# Patient Record
Sex: Male | Born: 1990 | Race: White | Hispanic: No | Marital: Married | Smoking: Never smoker
Health system: Southern US, Academic
[De-identification: ages and names within clinical notes are randomized; demographics above are authoritative.]

## PROBLEM LIST (undated history)

## (undated) DIAGNOSIS — F419 Anxiety disorder, unspecified: Secondary | ICD-10-CM

## (undated) DIAGNOSIS — I32 Pericarditis in diseases classified elsewhere: Secondary | ICD-10-CM

## (undated) DIAGNOSIS — Z973 Presence of spectacles and contact lenses: Secondary | ICD-10-CM

## (undated) HISTORY — PX: CARDIAC CATHETERIZATION: SHX172

## (undated) HISTORY — PX: MOUTH SURGERY: SHX715

## (undated) HISTORY — PX: HX HEART CATHETERIZATION: SHX148

---
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
## 2022-03-29 IMAGING — DX XRAY KNEE 4 OR MORE VIEWS RT
1 series · 3 of 3 positions shown · non-contrast
Comparison: None available.

﻿EXAM:  83181   XRAY KNEE 3 VIEWS LT,XRAY KNEE 3 VIEWS RT, INCLUDING AP WEIGHT-BEARING VIEWS:
INDICATION: Bilateral chronic knee pain.  Trauma due to fall 2 months ago.
TECHNIQUE: Three views of both knees including AP weight-bearing views.

[Series 1: apweightbearing · 0.14mm/px · 3 of 3 slices shown]
[im 1/3]
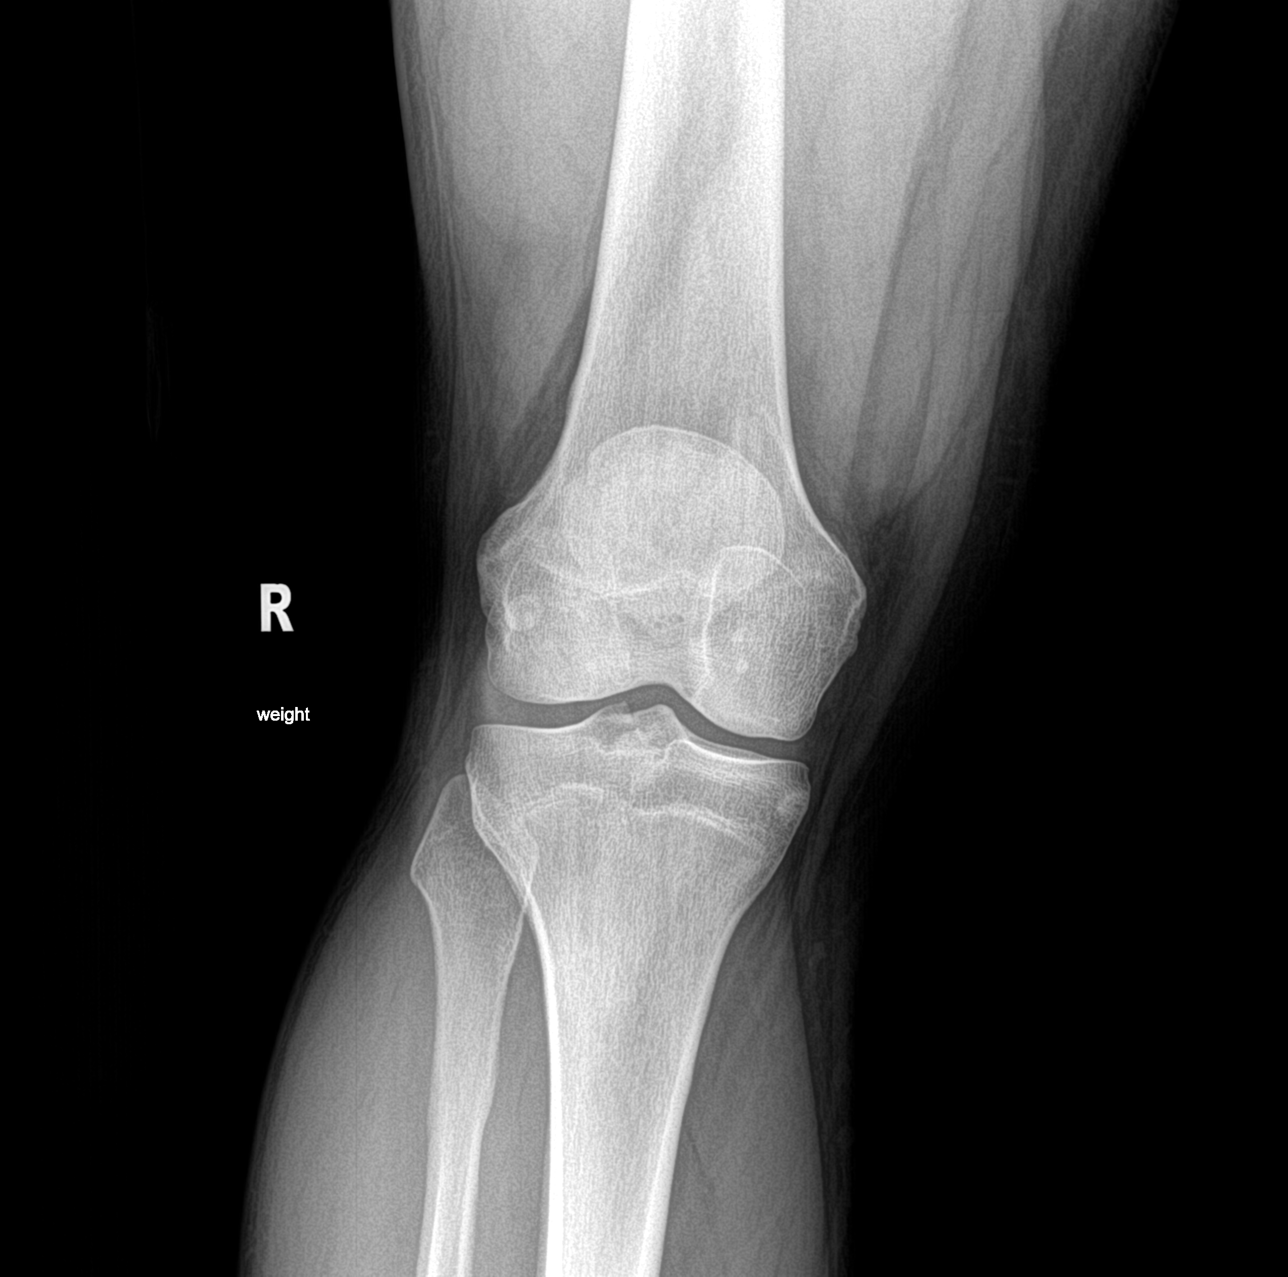
[im 2/3]
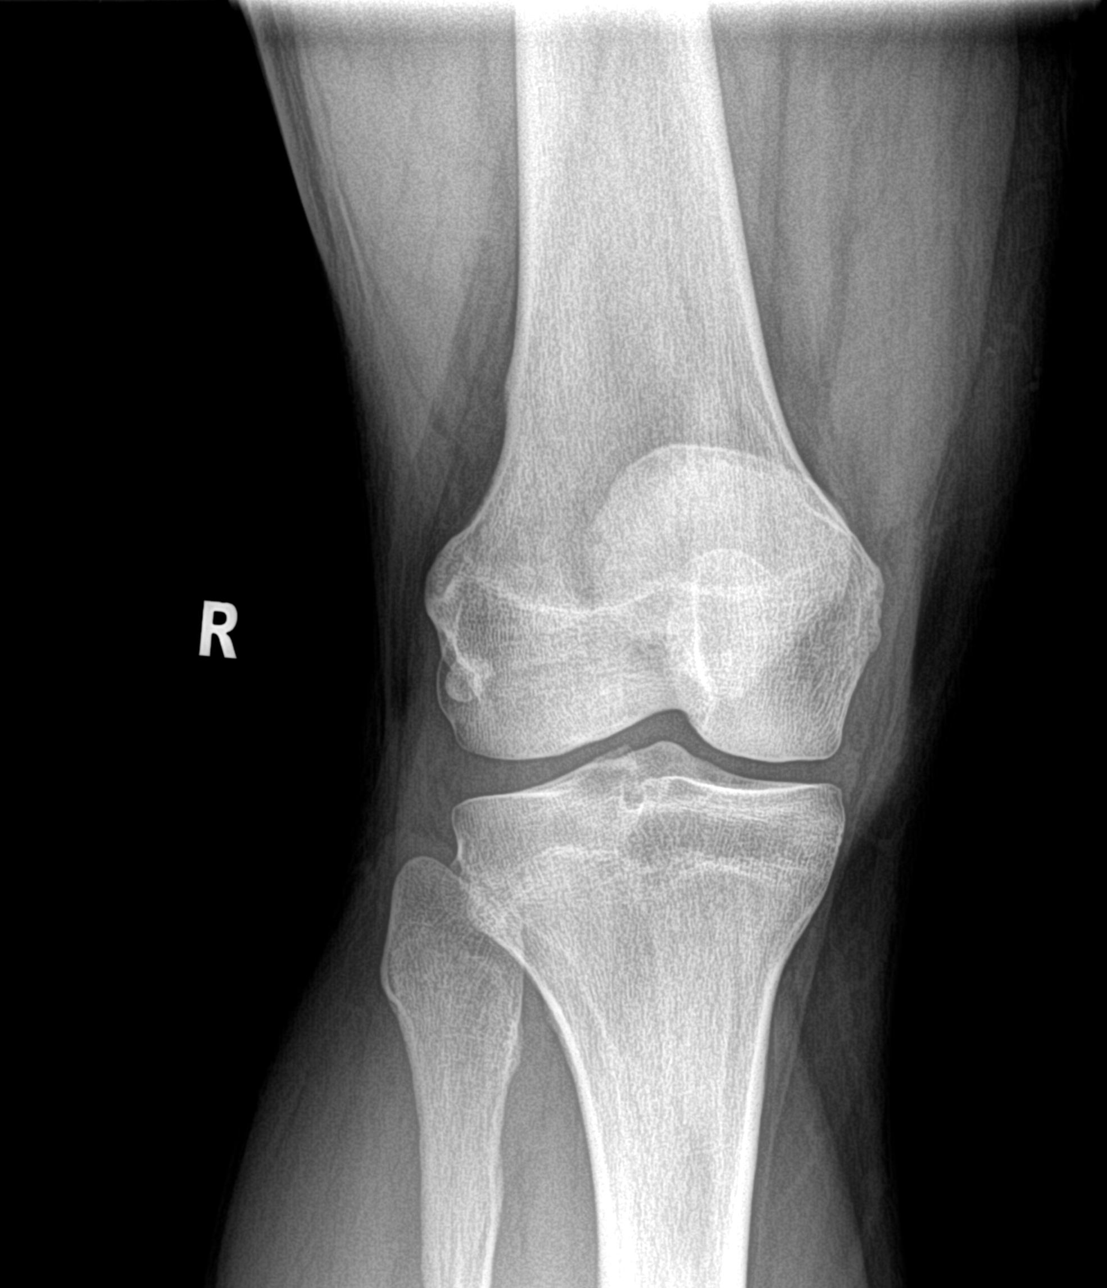
[im 3/3]
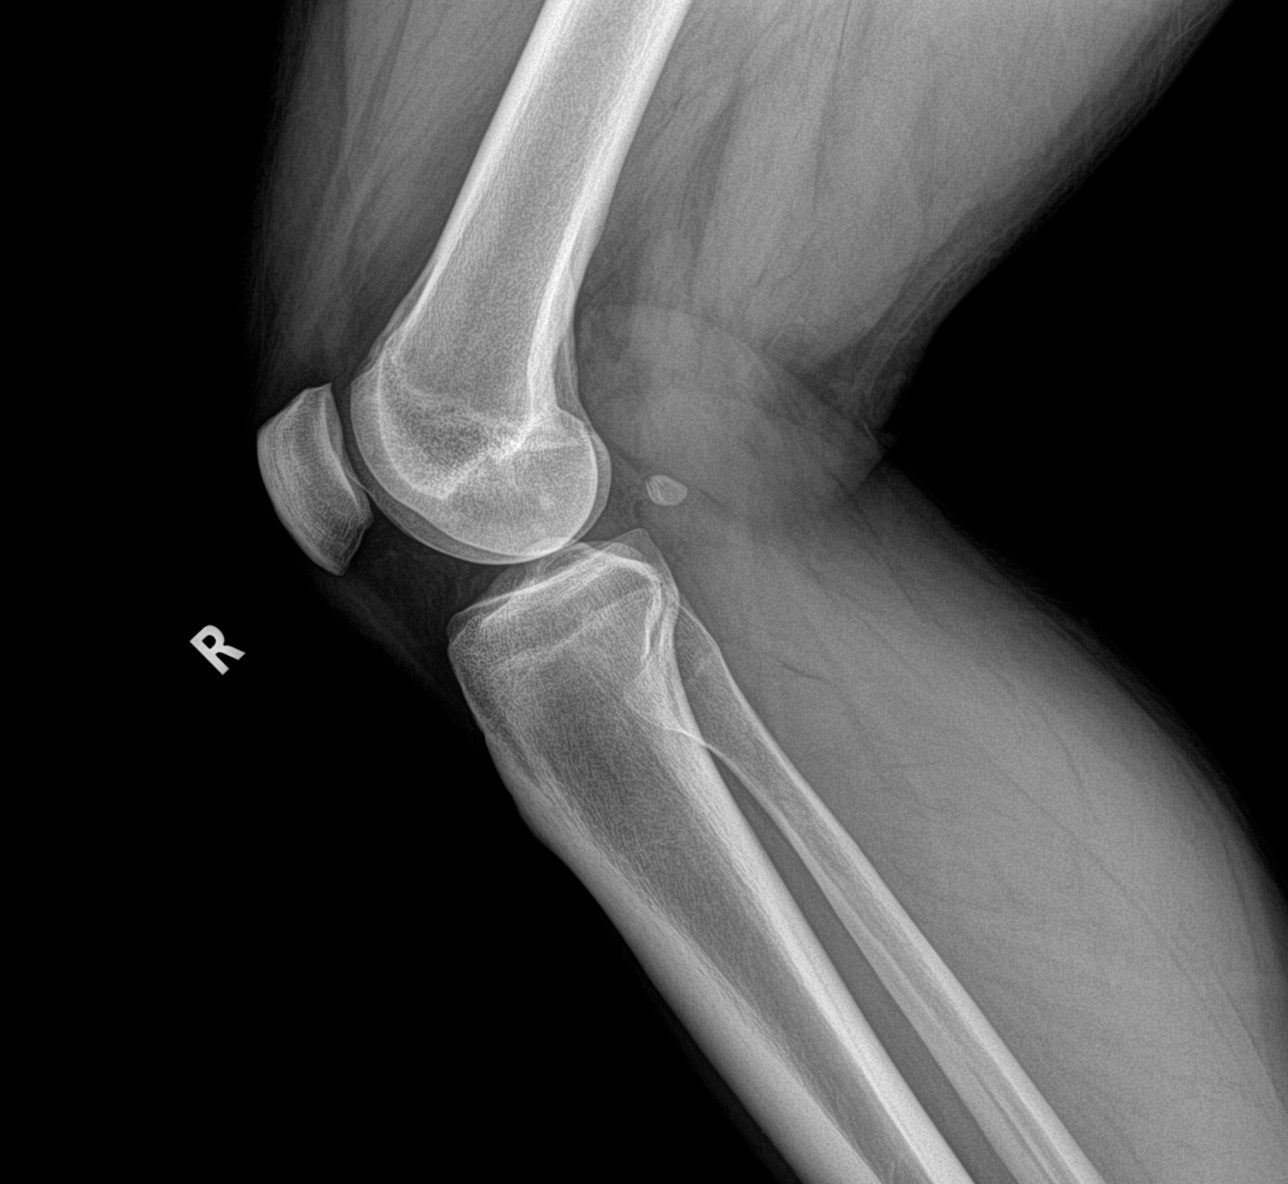

[3 of 3 positions shown; findings below may reference images not displayed]

FINDINGS: No acute bony lesions are seen at both knees.  Mild, grade 1 to grade 2 degenerative changes of medial compartment of both knee joints are noted. 

Small focal irregularity of the bony cortex is noted at the metaphyseal region of the proximal left tibia on the medial aspect. This could be secondary to an old trauma.  This does not appear to be an aggressive lesion.  Soft tissues are unremarkable.
IMPRESSION: 1. No acute bone changes of both knees. Mild osteoarthritis of medial compartment of both knees.

2. Focal irregularity of bony cortex over the medial aspect of the proximal left tibia in the metaphyseal region.  Possible changes due to old trauma versus a flat osteochondroma.  If patient has localized symptoms, further evaluation by MRI including postcontrast study can be performed.  

Electronically Signed by REINER, CARLOSJ at [id number]-RDRG [DATE]

## 2022-03-29 IMAGING — DX XRAY KNEE 4 OR MORE VIEWS LT
1 series · 3 of 3 positions shown · non-contrast
Comparison: None available.

﻿EXAM:  83181   XRAY KNEE 3 VIEWS LT,XRAY KNEE 3 VIEWS RT, INCLUDING AP WEIGHT-BEARING VIEWS:
INDICATION: Bilateral chronic knee pain.  Trauma due to fall 2 months ago.
TECHNIQUE: Three views of both knees including AP weight-bearing views.

[Series 1: apweightbearing · 0.14mm/px · 3 of 3 slices shown]
[im 1/3]
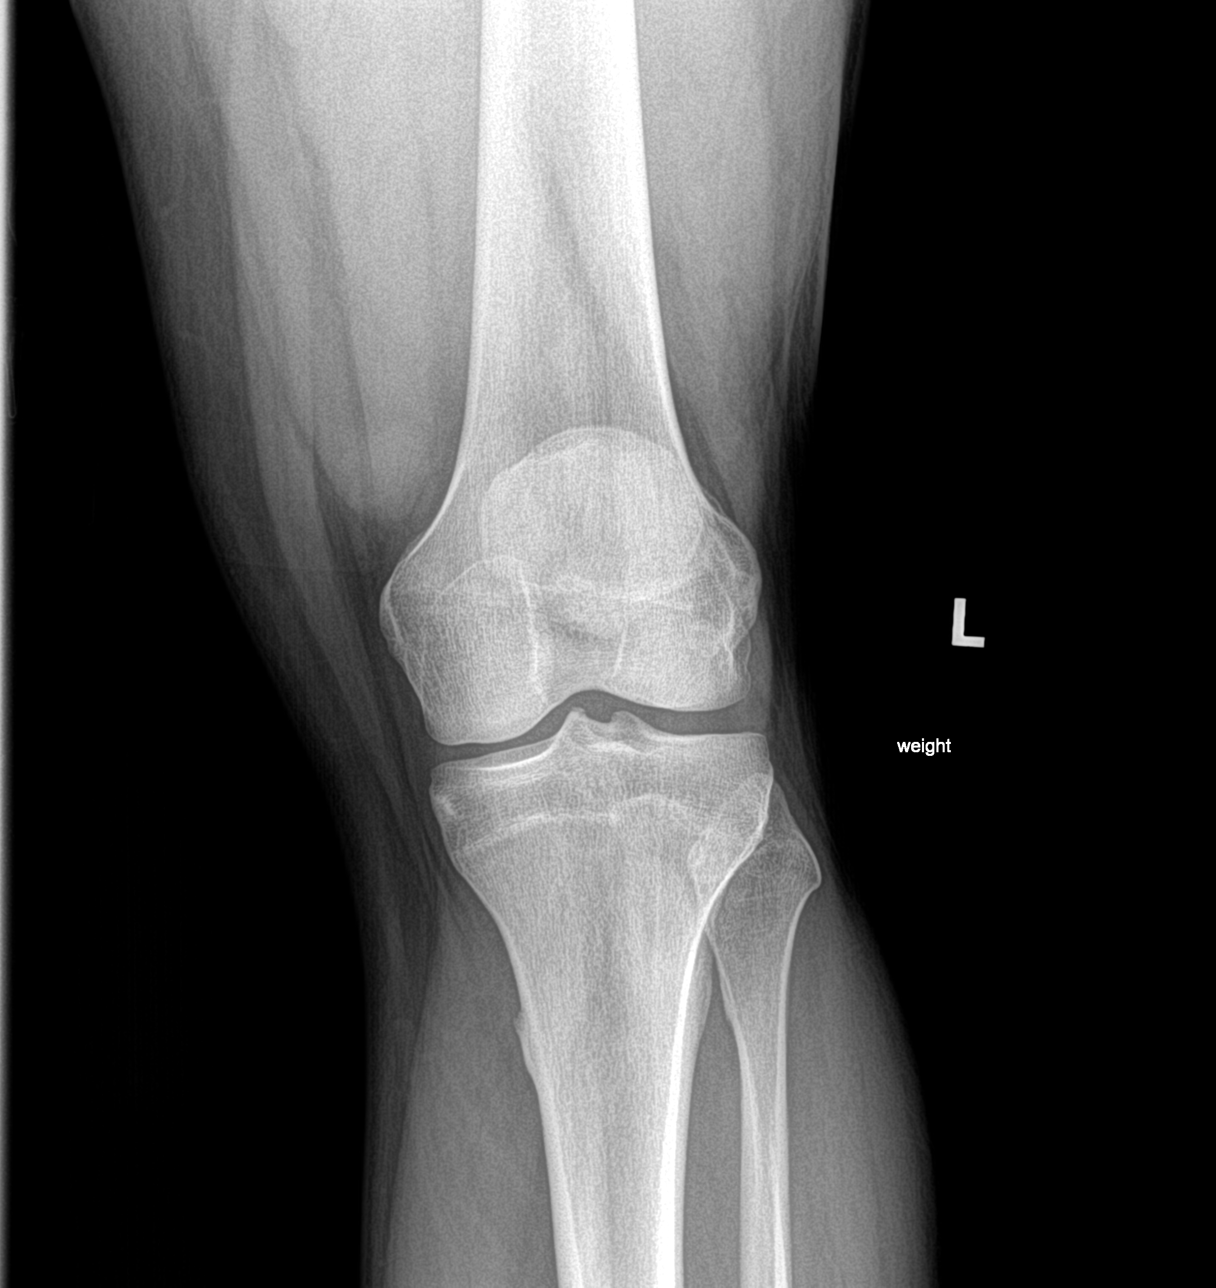
[im 2/3]
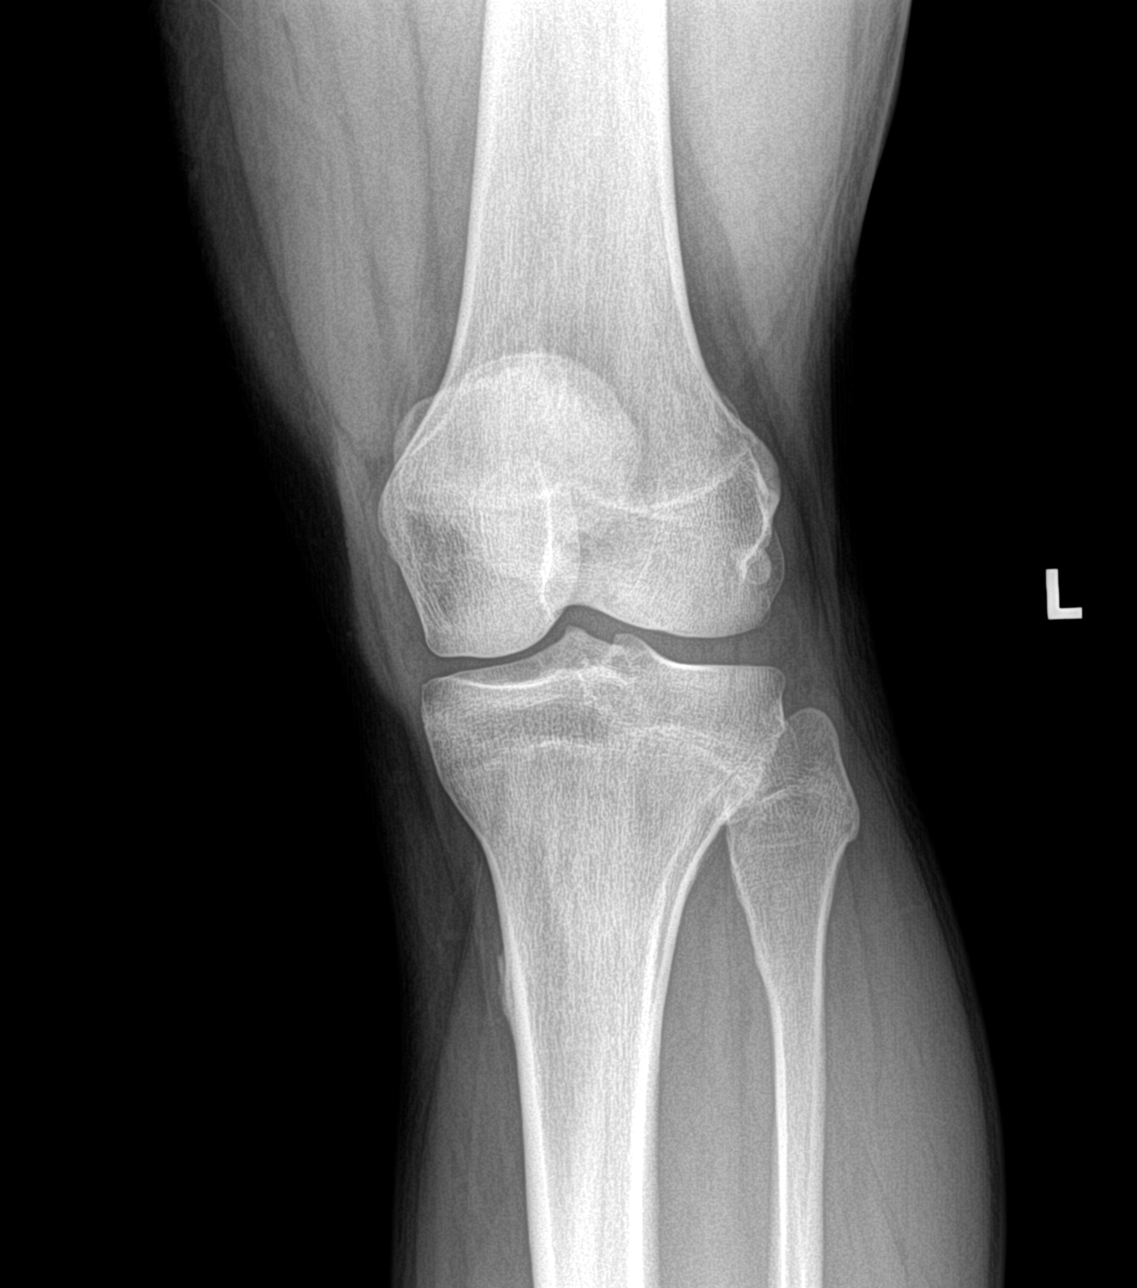
[im 3/3]
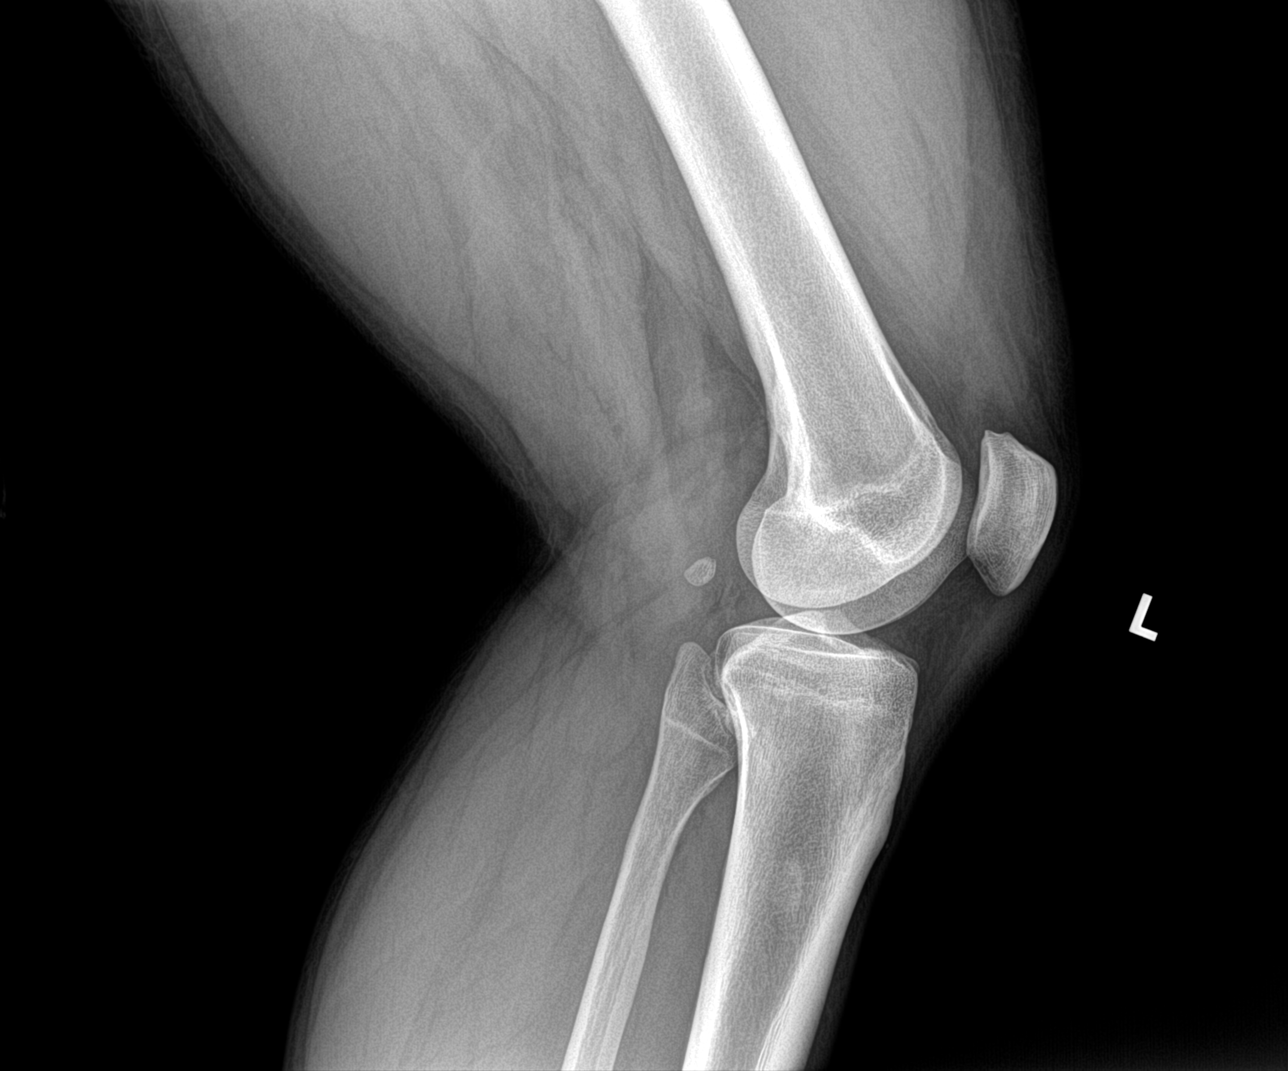

[3 of 3 positions shown; findings below may reference images not displayed]

FINDINGS: No acute bony lesions are seen at both knees.  Mild, grade 1 to grade 2 degenerative changes of medial compartment of both knee joints are noted. 

Small focal irregularity of the bony cortex is noted at the metaphyseal region of the proximal left tibia on the medial aspect. This could be secondary to an old trauma.  This does not appear to be an aggressive lesion.  Soft tissues are unremarkable.
IMPRESSION: 1. No acute bone changes of both knees. Mild osteoarthritis of medial compartment of both knees.

2. Focal irregularity of bony cortex over the medial aspect of the proximal left tibia in the metaphyseal region.  Possible changes due to old trauma versus a flat osteochondroma.  If patient has localized symptoms, further evaluation by MRI including postcontrast study can be performed.  

Electronically Signed by REINER, CARLOSJ at 08-9ov-RDRG [DATE]

## 2022-04-20 IMAGING — MR MRI KNEE RT W/O CONTRAST
4 of 5 series · 26 of 40 positions shown · non-contrast
Comparison: None available.

﻿EXAM:  [id number]   MRI KNEE RT W/O CONTRAST
INDICATION: Bilateral knee pain.
TECHNIQUE: Noncontrast multiplanar, multisequence MRI was performed.

[Series 5: PD fat-sat · axial · right · 4.0mm · 0.53mm/px · z∈[-81,+50]mm · 8 of 30 slices shown (1 of 3)]
[im 1/30]
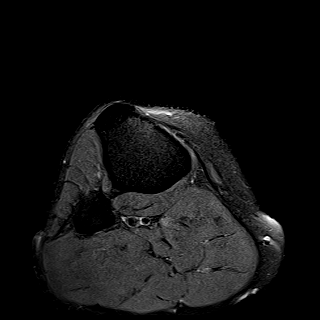
[im 5/30]
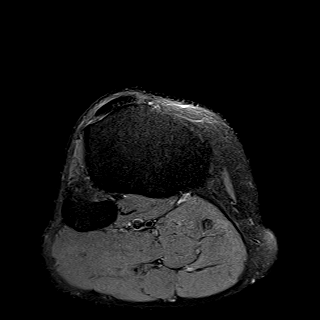
[im 9/30]
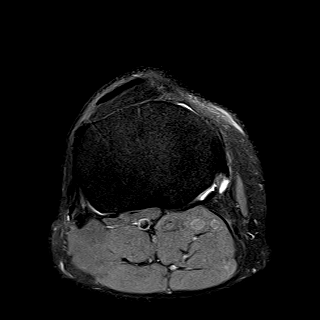
[im 13/30]
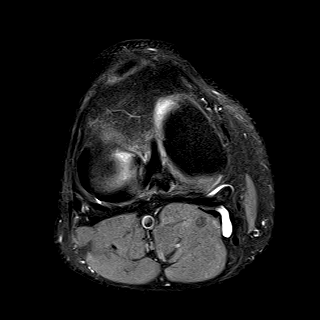
[im 17/30]
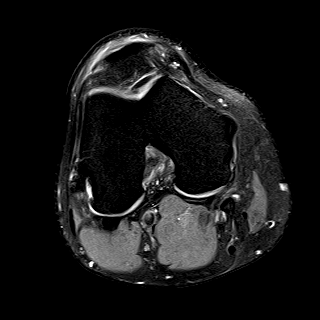
[im 21/30]
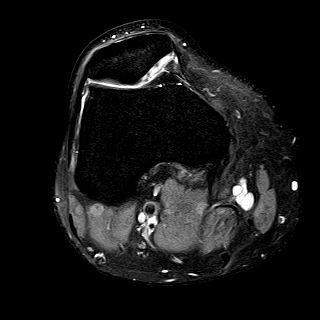
[im 25/30]
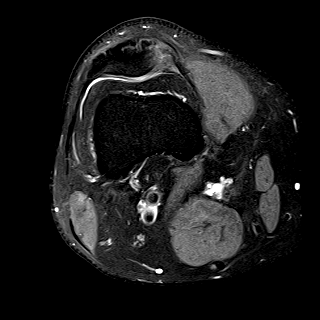
[im 30/30]
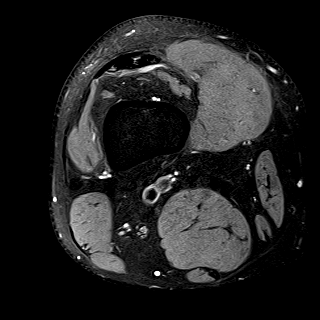

[Series 6: PD fat-sat · sagittal · right · 3.0mm · 0.33mm/px · 8 of 30 slices shown (2 of 3)]
[im 1/30]
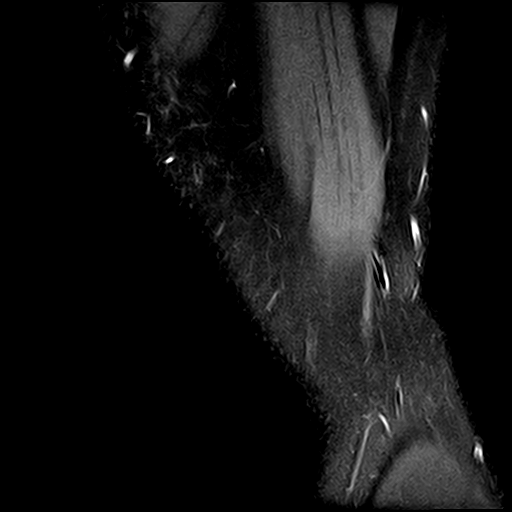
[im 5/30]
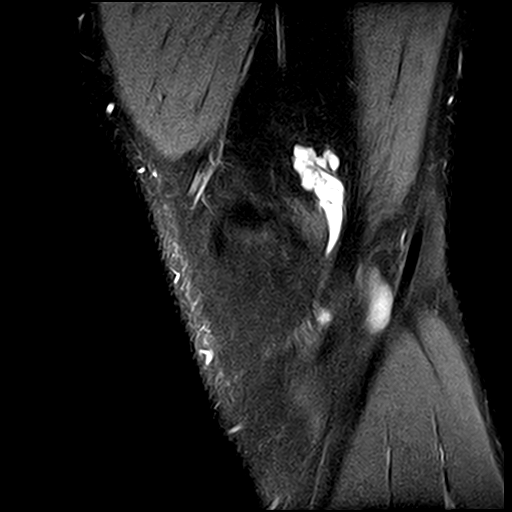
[im 9/30]
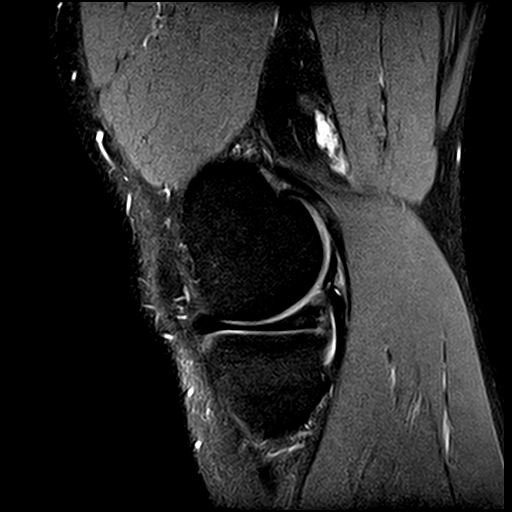
[im 13/30]
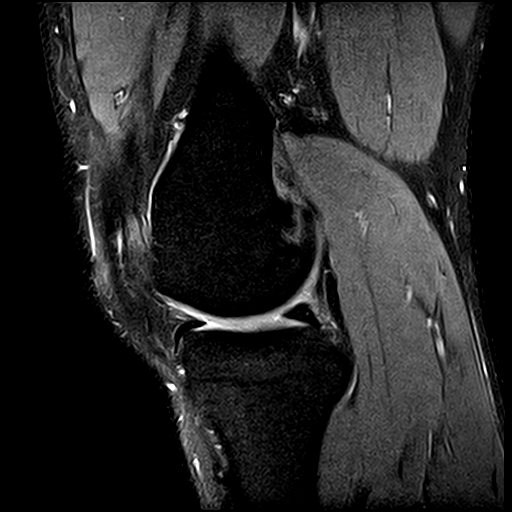
[im 17/30]
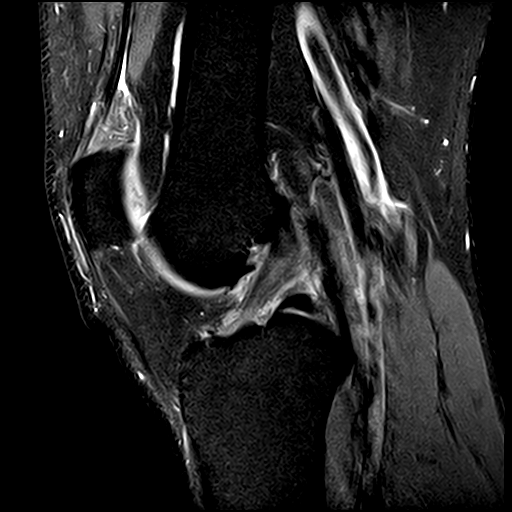
[im 21/30]
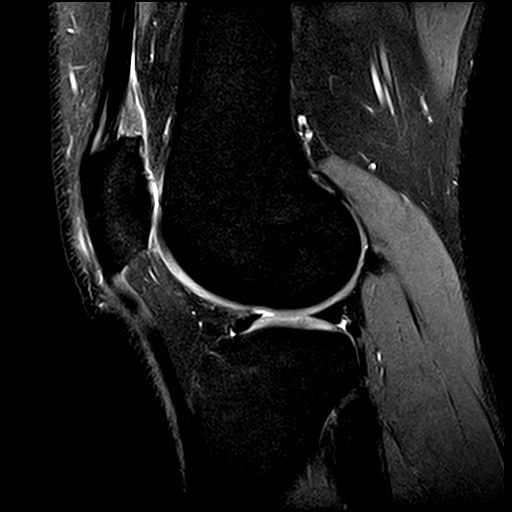
[im 25/30]
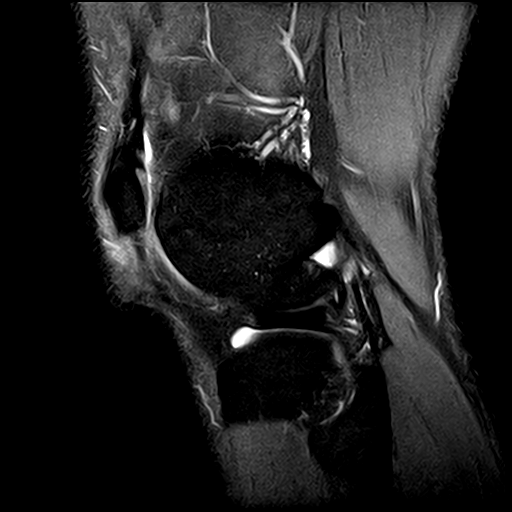
[im 30/30]
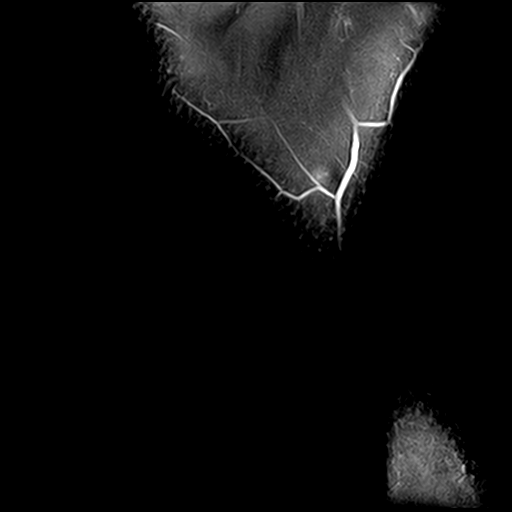

[Series 7: T1 · sagittal · right · 3.0mm · 0.29mm/px · 3 of 30 slices shown]
[im 5/30]
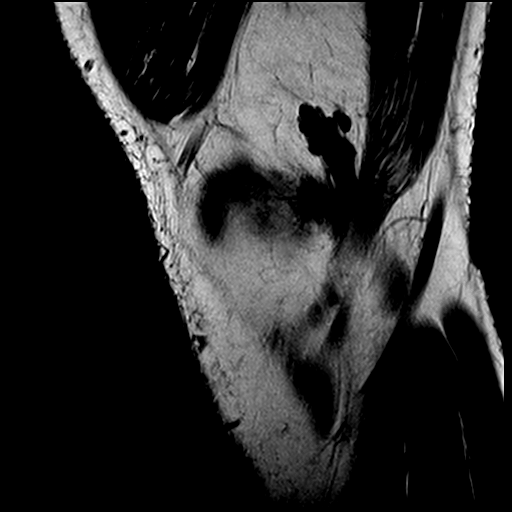
[im 17/30]
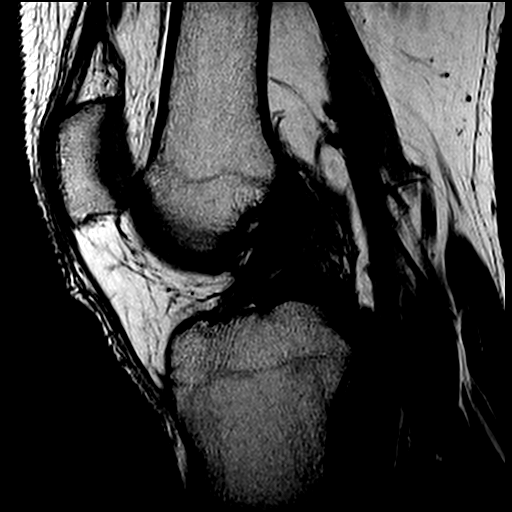
[im 25/30]
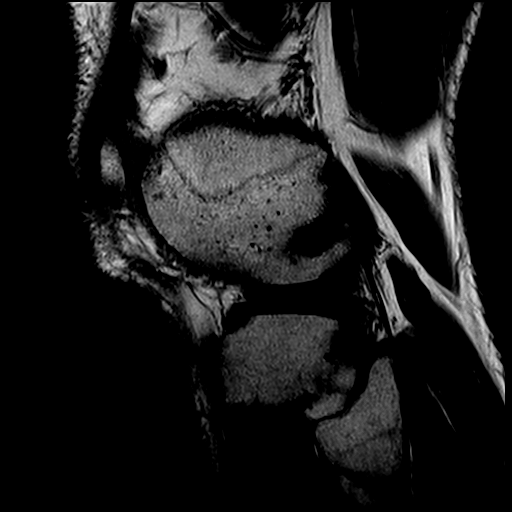

[Series 9: PD fat-sat · coronal · right · 3.0mm · 0.36mm/px · 7 of 27 slices shown (3 of 3)]
[im 1/27]
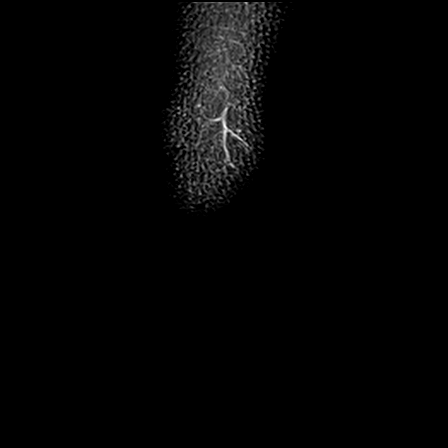
[im 4/27]
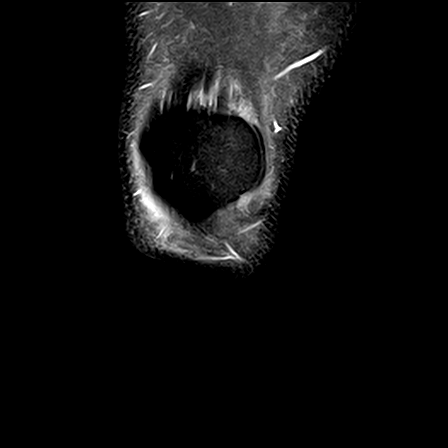
[im 8/27]
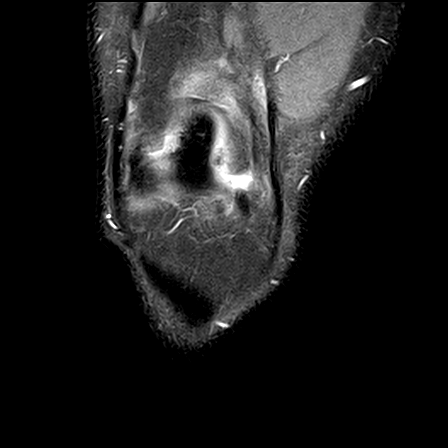
[im 12/27]
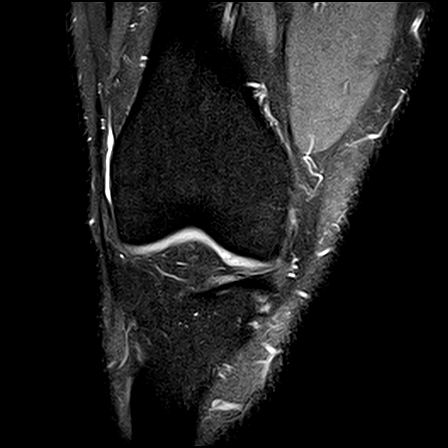
[im 15/27]
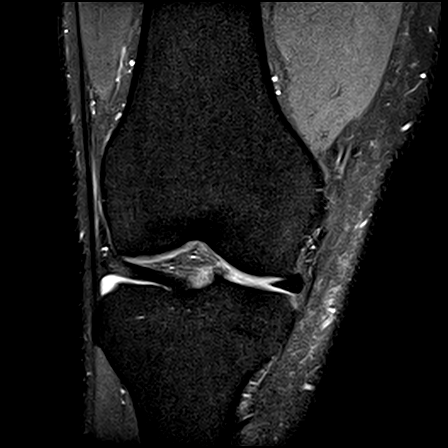
[im 19/27]
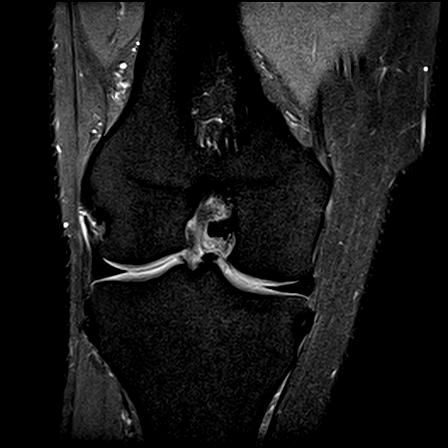
[im 23/27]
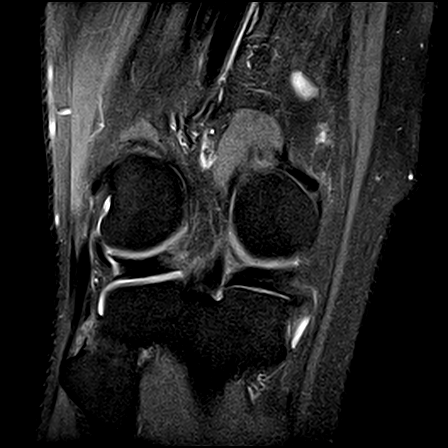

[26 of 40 positions shown; findings below may reference images not displayed]

FINDINGS: In the soft tissues posteromedial to the distal femoral metaphysis, there is a 3 cm multiloculated cystic lesion with a benign appearance.  This is similar in size, location, and appearance to the lesion described in the opposite knee.  The appearance is nonspecific, and this is of uncertain etiology, but this is likely benign.  Possibilities include bursitis or a ganglion cyst.  

The menisci, cruciate ligaments, collateral ligaments, and extensor tendons appear intact.  There is no fracture, dislocation, bone contusion, or significant degenerative change.
IMPRESSION: Multicystic lesion that could represent a ganglion cyst or bursitis.

## 2022-04-20 IMAGING — MR MRI KNEE LT W/O CONTRAST
5 of 7 series · 26 of 40 positions shown · non-contrast
Comparison: Recent plain films dated March 29, 2022.

﻿EXAM:  [id number]   MRI KNEE LT W/O CONTRAST
INDICATION: Bilateral knee pain.
TECHNIQUE: Noncontrast multiplanar, multisequence MRI was performed.

[Series 5: PD fat-sat · axial · left · 4.0mm · 0.53mm/px · z∈[-78,+53]mm · 6 of 30 slices shown (1 of 3)]
[im 1/30]
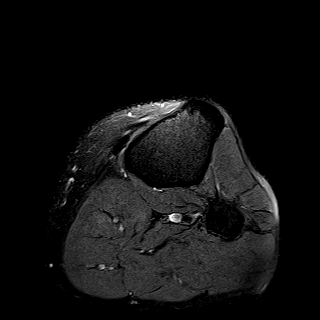
[im 6/30]
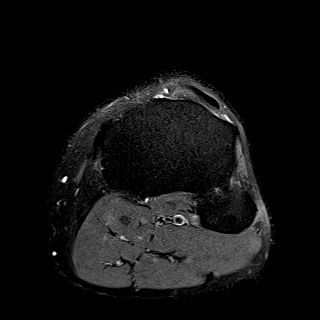
[im 12/30]
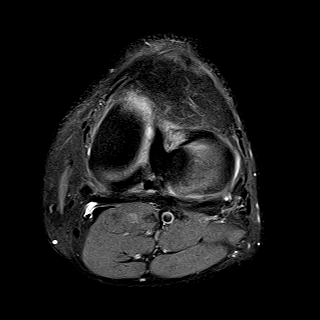
[im 18/30]
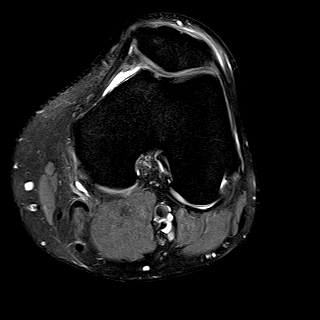
[im 24/30]
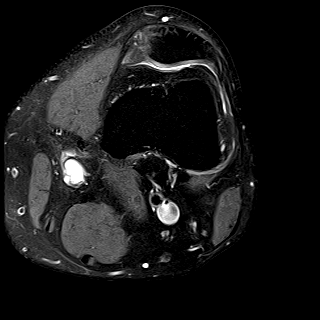
[im 30/30]
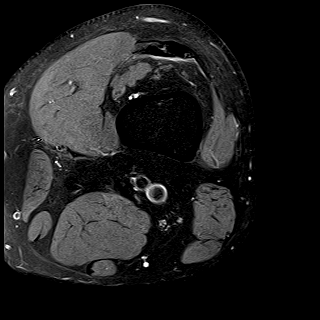

[Series 6: PD fat-sat · sagittal · left · 3.0mm · 0.31mm/px · 6 of 30 slices shown (2 of 3)]
[im 1/30]
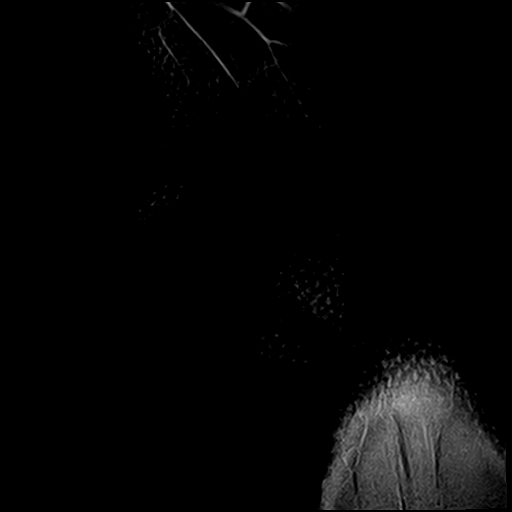
[im 6/30]
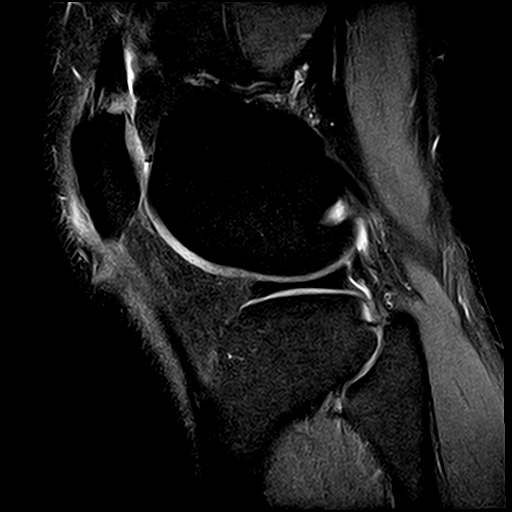
[im 12/30]
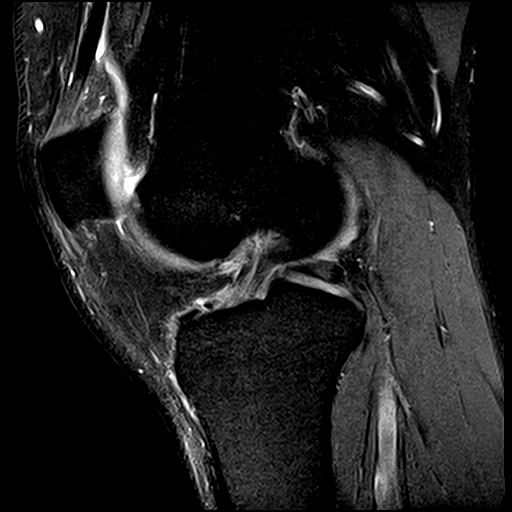
[im 18/30]
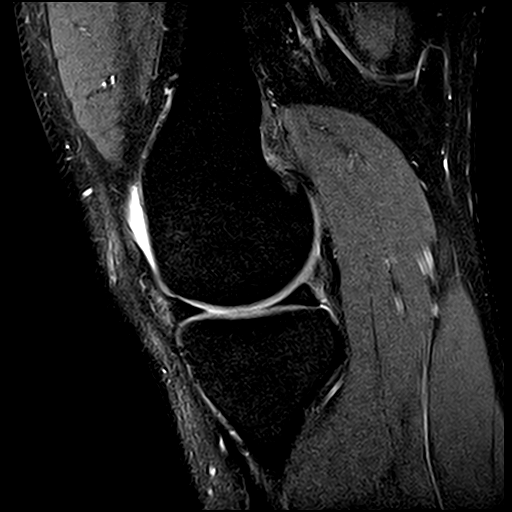
[im 24/30]
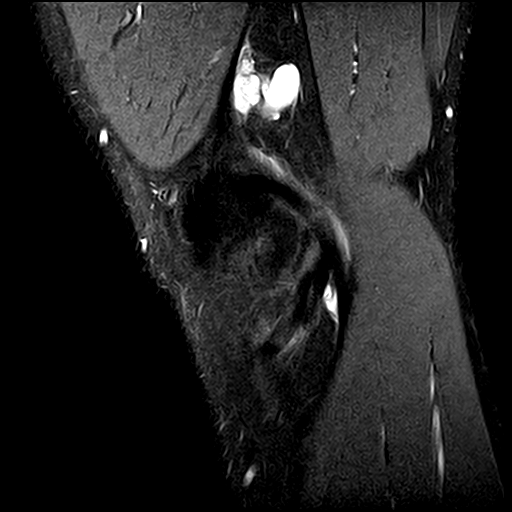
[im 30/30]
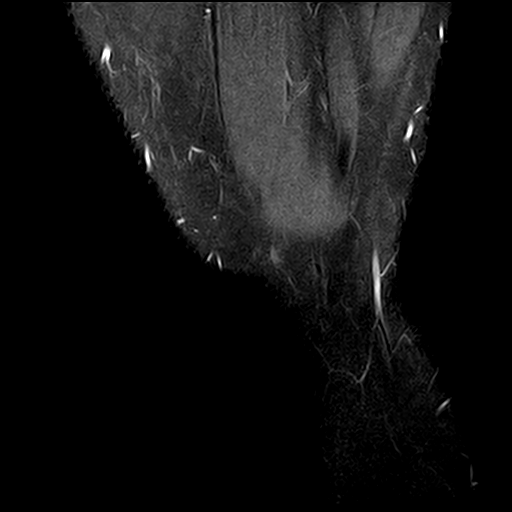

[Series 7: T1 · sagittal · left · 3.0mm · 0.31mm/px · 3 of 30 slices shown]
[im 1/30]
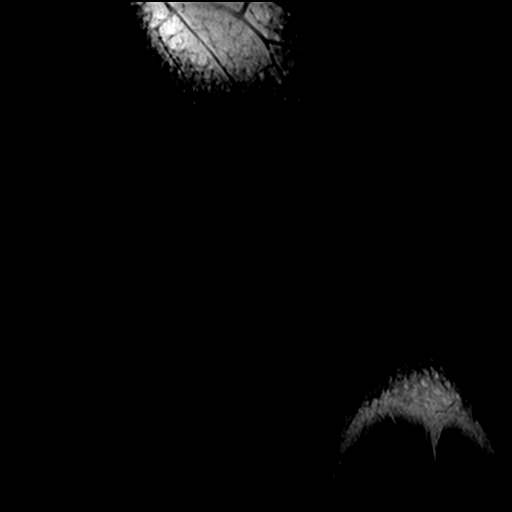
[im 6/30]
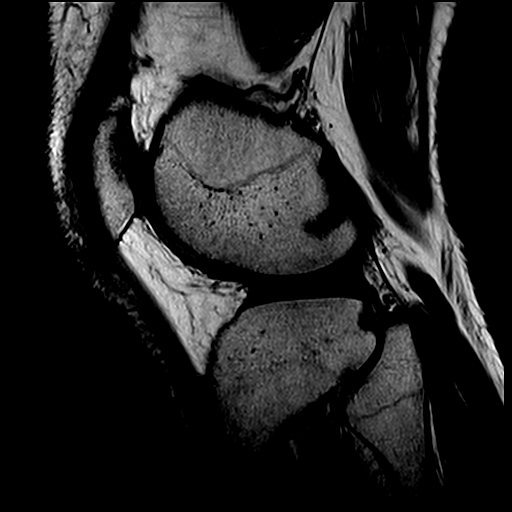
[im 12/30]
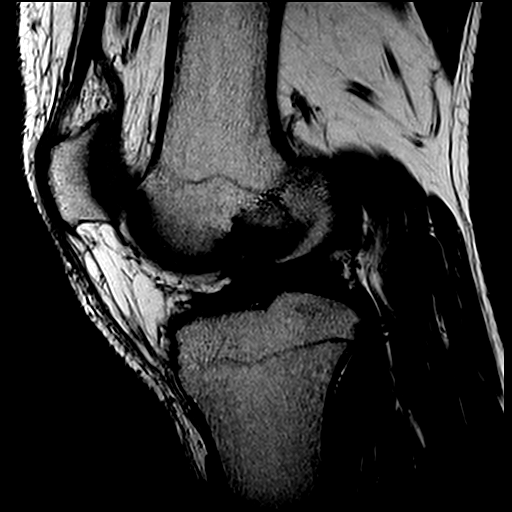

[Series 9: PD fat-sat · coronal · left · 3.0mm · 0.36mm/px · 5 of 27 slices shown (3 of 3)]
[im 1/27]
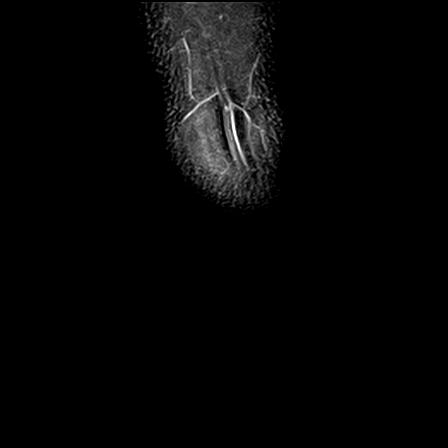
[im 7/27]
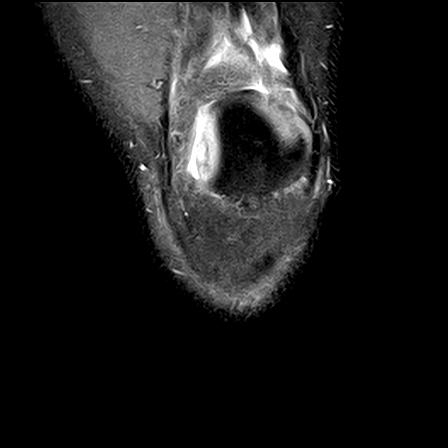
[im 14/27]
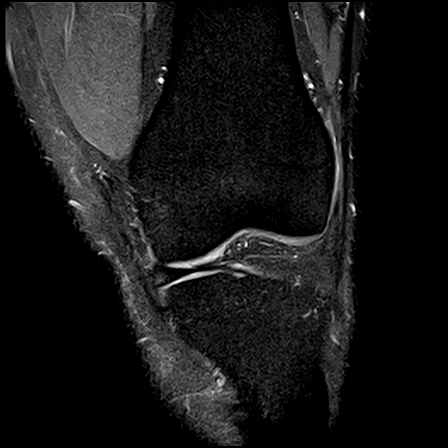
[im 20/27]
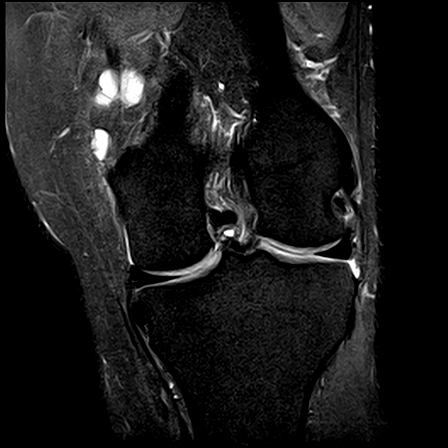
[im 27/27]
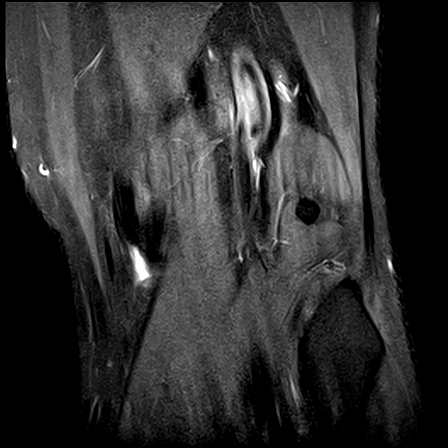

[Series 10: PD · coronal · left · 3.1mm · 0.36mm/px · 6 of 29 slices shown]
[im 1/29]
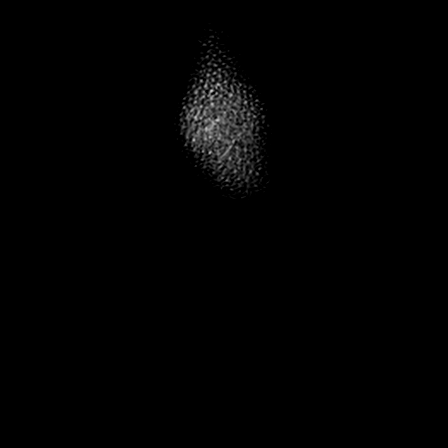
[im 6/29]
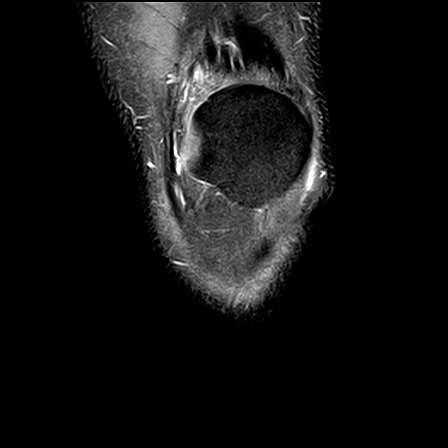
[im 12/29]
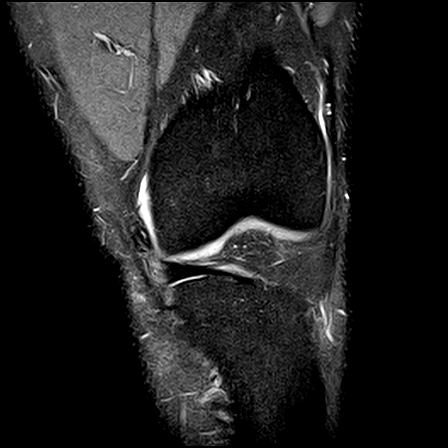
[im 17/29]
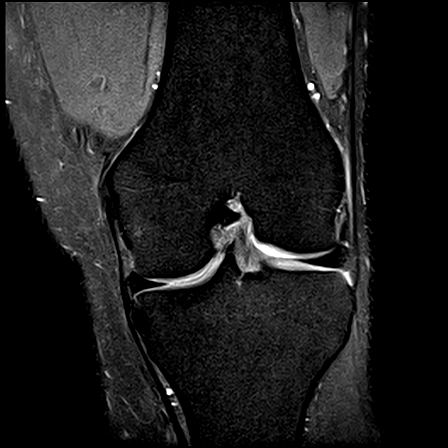
[im 23/29]
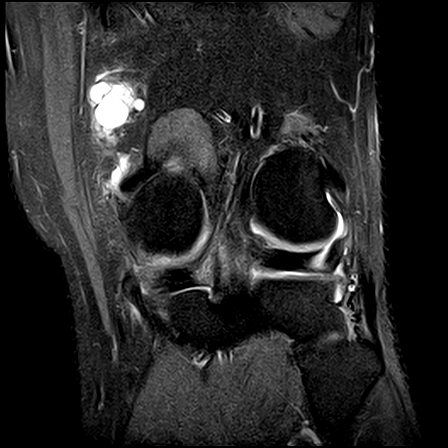
[im 29/29]
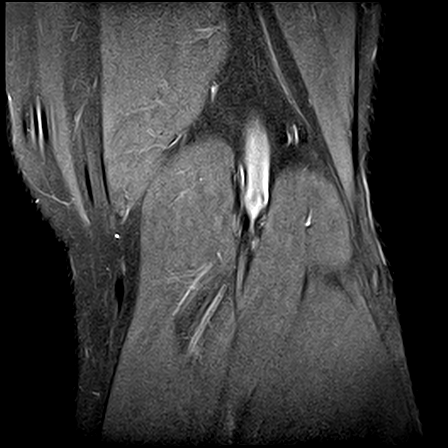

[26 of 40 positions shown; findings below may reference images not displayed]

FINDINGS: The small lesion noted previously along the medial metaphysis of the proximal tibia demonstrates no corresponding abnormality on the current MRI examination.  This may reflect an old injury of the distal medial collateral ligament.  The medial collateral ligament appears intact on the current MRI examination.  

The lateral collateral ligament is also intact as are the menisci, cruciate ligaments, and extensor tendons.  There is no fracture, dislocation, marrow signal alteration, bone contusion, or significant degenerative change.  There is no significant joint effusion.  

There is a multiloculated 3 cm cystic lesion in the soft tissues posteromedial to the distal femoral metaphysis.  This is of uncertain etiology but is likely benign.
IMPRESSION: 1. A 3 cm multicystic posteromedial soft tissue lesion.  

2. No internal derangement.

## 2022-05-01 ENCOUNTER — Ambulatory Visit (INDEPENDENT_AMBULATORY_CARE_PROVIDER_SITE_OTHER): Admitting: NURSE PRACTITIONER

## 2022-05-18 ENCOUNTER — Ambulatory Visit (INDEPENDENT_AMBULATORY_CARE_PROVIDER_SITE_OTHER): Admitting: NURSE PRACTITIONER

## 2022-05-18 ENCOUNTER — Other Ambulatory Visit: Payer: Self-pay

## 2022-05-18 ENCOUNTER — Encounter (INDEPENDENT_AMBULATORY_CARE_PROVIDER_SITE_OTHER): Payer: Self-pay | Admitting: NURSE PRACTITIONER

## 2022-05-18 VITALS — Ht 76.0 in | Wt 300.0 lb

## 2022-05-18 DIAGNOSIS — R519 Headache, unspecified: Secondary | ICD-10-CM

## 2022-05-18 DIAGNOSIS — J342 Deviated nasal septum: Secondary | ICD-10-CM

## 2022-05-18 DIAGNOSIS — J343 Hypertrophy of nasal turbinates: Secondary | ICD-10-CM

## 2022-05-18 MED ORDER — AZELASTINE 137 MCG (0.1 %) NASAL SPRAY
1.0000 | Freq: Two times a day (BID) | NASAL | 11 refills | Status: DC
Start: 2022-05-18 — End: 2022-10-05

## 2022-05-18 MED ORDER — FLUTICASONE PROPIONATE 50 MCG/ACTUATION NASAL SPRAY,SUSPENSION
1.0000 | Freq: Two times a day (BID) | NASAL | 11 refills | Status: DC
Start: 2022-05-18 — End: 2022-07-27

## 2022-05-18 NOTE — Procedures (Signed)
ENT, [facility]  [address]  [place] [address]    Procedure Note    Name: Mark Patton MRN:  [id number]   Date: 05/18/2022 Age: 31 y.o.  DOB:   1991-03-25       31231 - NASAL ENDOSCOPY DIAGNOSTIC UNILATERAL OR BILATERAL (AMB ONLY)    Performed by: Elnora Morrison, FNP-BC  Authorized by: Elnora Morrison, FNP-BC    Time Out:     Immediately before the procedure, a time out was called:  Yes    Patient verified:  Yes    Procedure Verified:  Yes    Site Verified:  Yes  Documentation:      Indications for procedure: Obstructive nasal breathing and Facial pain / Headache    Anesthesia: Oxymetazoline nasal spray    Description: Nasal endoscopy with rigid scope was performed with examination of the  septum, inferior, middle, and superior meatus, turbinates, sphenoethmoidal recess, and nasopharynx.     There were no polyps, pus, or granulation tissue noted.  ET orifices and nasopharynx were normal.     Findings: Septal deviation and Inferior turbinate hypertrophy    The patient tolerated the procedure well.               Elnora Morrison, FNP-BC

## 2022-05-18 NOTE — Progress Notes (Signed)
ENT, [facility]  [address]  [place] [address]  Phone: [phone]  Fax: [phone]      Encounter Date: 05/18/2022    Patient ID: Mark Patton  MRN: [id number]    DOB: 09-19-1990  Age: 31 y.o. male         Referring Provider:    No referring provider defined for this encounter.    Reason for Visit:   Chief Complaint   Patient presents with    Sinus Problem     New patient here for sinus problems. C/o right sides nasal congestion x 6 years and headaches        History of Present Illness:  Mark Patton is a 31 y.o. male referred for chronic sinusitis.Complains of episodes of right facial pain and pressure and constant right nasal congestion.  Has used Afrin in the past without improvement.      Patient History:  There is no problem list on file for this patient.    Current Outpatient Medications   Medication Sig    azelastine (ASTELIN) 137 mcg (0.1 %) Nasal Aerosol, Spray Administer 1 Spray into each nostril Twice daily Use in each nostril as directed    DULoxetine (CYMBALTA DR) 30 mg Oral Capsule, Delayed Release(E.C.) Take 1 capsule every day by oral route for 90 days.    fluticasone propionate (FLONASE) 50 mcg/actuation Nasal Spray, Suspension Administer 1 Spray into each nostril Twice daily     No Known Allergies  History reviewed. No pertinent past medical history.   Past Surgical History:   Procedure Laterality Date    HX HEART CATHETERIZATION        Family Medical History:    None         Social History     Tobacco Use    Smoking status: Never    Smokeless tobacco: Current   Substance Use Topics    Alcohol use: Not Currently    Drug use: Never       Review of Systems     Vitals:    05/18/22 1540   Weight: 136 kg (300 lb)   Height: 1.93 m (6\' 4" )   BMI: 36.59      ENT Physical Exam  Constitutional  Appearance: patient appears well-developed, well-nourished and well-groomed,  Communication/Voice: communication appropriate for developmental age; vocal quality normal;  Head and  Face  Appearance: head appears normal, face appears normal and face appears atraumatic;  Palpation: facial palpation normal;  Salivary: glands normal;  Ear  Hearing: intact;  Auricles: right auricle normal; left auricle normal;  External Mastoids: right external mastoid normal; left external mastoid normal;  Ear Canals: right ear canal normal; left ear canal normal;  Tympanic Membranes: right tympanic membrane normal; left tympanic membrane normal;  Nose  External Nose: nares patent bilaterally; external nose normal;  Internal Nose: nasal mucosa normal; nasal septal deviation present; bilateral inferior turbinates with hypertrophy;  Oral Cavity/Oropharynx  Lips: normal;  Teeth: normal;  Gums: gingiva normal;  Tongue: normal;  Oral mucosa: normal;  Hard palate: normal;  Soft palate: normal;  Tonsils: bilateral tonsils 2+, cryptic;  Base of Tongue: normal;  Posterior pharyngeal wall: normal;  Neck  Neck: neck normal; neck palpation normal;  Thyroid: thyroid normal;  Respiratory  Inspection: breathing unlabored; normal breathing rate;  Lymphatic  Palpation: no cervical adenopathy noted;  Neurovestibular  Mental Status: alert and oriented;  Psychiatric: mood normal; affect is appropriate;  Cranial Nerves: cranial nerves intact;  Assessment:  ENCOUNTER DIAGNOSES     ICD-10-CM   1. Facial pain  R51.9   2. Nasal septal deviation  J34.2   3. 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  Discussed nasal endoscopy findings with patient.  Start Astelin and Flonase daily to BID  CT sinuses ordered. May need Septoplasty and turbinate surgery    Orders Placed This Encounter    [phone] - NASAL ENDOSCOPY DIAGNOSTIC UNILATERAL OR BILATERAL (AMB ONLY)    CT SINUSES WO IV CONTRAST    fluticasone propionate (FLONASE) 50 mcg/actuation Nasal Spray, Suspension    azelastine (ASTELIN) 137 mcg (0.1 %) Nasal Aerosol, Spray     No follow-ups on file.    Elnora Morrison, FNP-BC  05/18/2022,  16:06

## 2022-05-19 ENCOUNTER — Telehealth (INDEPENDENT_AMBULATORY_CARE_PROVIDER_SITE_OTHER): Payer: Self-pay | Admitting: NURSE PRACTITIONER

## 2022-05-23 ENCOUNTER — Ambulatory Visit (HOSPITAL_BASED_OUTPATIENT_CLINIC_OR_DEPARTMENT_OTHER): Payer: Self-pay

## 2022-06-13 ENCOUNTER — Encounter (INDEPENDENT_AMBULATORY_CARE_PROVIDER_SITE_OTHER): Payer: Self-pay | Admitting: NURSE PRACTITIONER

## 2022-06-15 ENCOUNTER — Ambulatory Visit (HOSPITAL_BASED_OUTPATIENT_CLINIC_OR_DEPARTMENT_OTHER): Payer: Self-pay

## 2022-06-16 ENCOUNTER — Telehealth (INDEPENDENT_AMBULATORY_CARE_PROVIDER_SITE_OTHER): Payer: Self-pay | Admitting: NURSE PRACTITIONER

## 2022-06-20 ENCOUNTER — Telehealth (INDEPENDENT_AMBULATORY_CARE_PROVIDER_SITE_OTHER): Payer: Self-pay | Admitting: NURSE PRACTITIONER

## 2022-06-29 ENCOUNTER — Encounter (INDEPENDENT_AMBULATORY_CARE_PROVIDER_SITE_OTHER): Payer: Self-pay | Admitting: NURSE PRACTITIONER

## 2022-07-14 ENCOUNTER — Other Ambulatory Visit: Payer: Self-pay

## 2022-07-14 ENCOUNTER — Inpatient Hospital Stay
Admission: RE | Admit: 2022-07-14 | Discharge: 2022-07-14 | Disposition: A | Discharge status: Home / Self Care | Source: Ambulatory Visit | Attending: NURSE PRACTITIONER | Admitting: NURSE PRACTITIONER

## 2022-07-14 DIAGNOSIS — R519 Headache, unspecified: Secondary | ICD-10-CM | POA: Insufficient documentation

## 2022-07-14 DIAGNOSIS — J342 Deviated nasal septum: Secondary | ICD-10-CM

## 2022-07-27 ENCOUNTER — Ambulatory Visit (INDEPENDENT_AMBULATORY_CARE_PROVIDER_SITE_OTHER): Admitting: NURSE PRACTITIONER

## 2022-07-27 ENCOUNTER — Other Ambulatory Visit: Payer: Self-pay

## 2022-07-27 ENCOUNTER — Encounter (INDEPENDENT_AMBULATORY_CARE_PROVIDER_SITE_OTHER): Payer: Self-pay | Admitting: NURSE PRACTITIONER

## 2022-07-27 VITALS — Ht 76.0 in | Wt 315.0 lb

## 2022-07-27 DIAGNOSIS — J343 Hypertrophy of nasal turbinates: Secondary | ICD-10-CM

## 2022-07-27 DIAGNOSIS — J329 Chronic sinusitis, unspecified: Secondary | ICD-10-CM

## 2022-07-27 DIAGNOSIS — J342 Deviated nasal septum: Secondary | ICD-10-CM

## 2022-07-27 MED ORDER — QNASL 80 MCG/ACTUATION NASAL AEROSOL SPRAY
160.0000 ug | INHALATION_SPRAY | Freq: Every day | NASAL | 2 refills | Status: DC
Start: 2022-07-27 — End: 2022-07-31

## 2022-07-27 NOTE — Progress Notes (Signed)
ENT, [place]  [place] [address]  Phone: [phone]  Fax: [phone]      Encounter Date: 07/27/2022    Patient ID: Mark Patton  MRN: [id number]    DOB: [birth year]  Age: 32 y.o. male     Progress Note       Referring Provider:  No ref. provider found    Reason for Visit:   Chief Complaint   Patient presents with    Follow-up After Testing     Rc after ct sinus, patient complains of nasal congestion and sinus pressure R side        History of Present Illness:  Mark Patton is a 32 y.o. male follow up chronic sinusitis/nasal congestion.  Started on Astelin and Flonase last visit and CT sinuses completed. No improvement in nasal congestion      Patient History:  There is no problem list on file for this patient.    Current Outpatient Medications   Medication Sig    azelastine (ASTELIN) 137 mcg (0.1 %) Nasal Aerosol, Spray Administer 1 Spray into each nostril Twice daily Use in each nostril as directed    beclomethasone dipropionate (QNASL) 80 mcg/actuation Nasal HFA Aerosol Inhaler Administer 2 Inhalers (160 mcg total) into affected nostril(s) Once a day    busPIRone (BUSPAR) 5 mg Oral Tablet Take 1 tablet twice a day by oral route for 90 days.    DULoxetine (CYMBALTA DR) 60 mg Oral Capsule, Delayed Release(E.C.)       No Known Allergies  No past medical history on file.  Past Surgical History:   Procedure Laterality Date    HX HEART CATHETERIZATION       Family Medical History:    None         Social History     Tobacco Use    Smoking status: Never    Smokeless tobacco: Current   Substance Use Topics    Alcohol use: Not Currently    Drug use: Never       Review of Systems     Vitals:    07/27/22 0840   Weight: (!) 143 kg (315 lb)   Height: 1.93 m (6\' 4" )   BMI: 38.42      ENT Physical Exam  Constitutional  Appearance: patient appears well-developed, well-nourished and well-groomed,  Communication/Voice: communication appropriate for developmental age; vocal quality  normal;  Head and Face  Appearance: head appears normal, face appears normal and face appears atraumatic;  Palpation: facial palpation normal;  Salivary: glands normal;  Ear  Hearing: intact;  Auricles: right auricle normal; left auricle normal;  External Mastoids: right external mastoid normal; left external mastoid normal;  Ear Canals: right ear canal normal; left ear canal normal;  Tympanic Membranes: right tympanic membrane normal; left tympanic membrane normal;  Nose  External Nose: nares patent bilaterally; external nose normal;  Internal Nose: nasal mucosa normal; nasal septal deviation present; bilateral inferior turbinates with hypertrophy;  Oral Cavity/Oropharynx  Lips: normal;  Teeth: normal;  Gums: gingiva normal;  Tongue: normal;  Oral mucosa: normal;  Hard palate: normal;  Soft palate: normal;  Tonsils: bilateral tonsils 2+, cryptic;  Base of Tongue: normal;  Posterior pharyngeal wall: normal;  Neck  Neck: neck normal; neck palpation normal;  Thyroid: thyroid normal;  Respiratory  Inspection: breathing unlabored; normal breathing rate;  Lymphatic  Palpation: no cervical adenopathy noted;  Neurovestibular  Mental Status: alert and oriented;  Psychiatric: mood normal; affect is appropriate;  Cranial Nerves: cranial nerves intact;       Assessment:  ENCOUNTER DIAGNOSES     ICD-10-CM   1. Nasal septal deviation  J34.2   2. Nasal turbinate hypertrophy  J34.3       Plan:  Medical records reviewed on 07/27/2022.  Reviewed CT sinuses images with patient.  Right NSD with spur and turbinate hypertrophy left is noted.  Paranasal sinuses are clear  Discussed Septoplasty and turbinate surgery, he is unsure if he wants this done.  Will stop Flonase and start Qnasl daily.  Continue Astelin daily to BID.    Orders Placed This Encounter    beclomethasone dipropionate (QNASL) 80 mcg/actuation Nasal HFA Aerosol Inhaler     Return in about 1 month (around 08/27/2022).    Emiliano Dyer, FNP-BC  07/27/2022, 08:57

## 2022-07-28 ENCOUNTER — Telehealth (INDEPENDENT_AMBULATORY_CARE_PROVIDER_SITE_OTHER): Payer: Self-pay | Admitting: NURSE PRACTITIONER

## 2022-07-28 NOTE — Telephone Encounter (Signed)
Per cover my meds, Qnasl does not require an [place]. [place] and Qnasl is not formulary, will not cover NDC. Insurance will cover Flonase, Mometasone and Flunisolide. Please advise

## 2022-07-31 MED ORDER — FLUNISOLIDE 25 MCG (0.025 %) NASAL SPRAY
2.0000 | Freq: Two times a day (BID) | NASAL | 3 refills | Status: DC
Start: 2022-07-31 — End: 2022-08-31

## 2022-08-31 ENCOUNTER — Other Ambulatory Visit: Payer: Self-pay

## 2022-08-31 ENCOUNTER — Ambulatory Visit (INDEPENDENT_AMBULATORY_CARE_PROVIDER_SITE_OTHER): Admitting: NURSE PRACTITIONER

## 2022-08-31 ENCOUNTER — Encounter (INDEPENDENT_AMBULATORY_CARE_PROVIDER_SITE_OTHER): Payer: Self-pay | Admitting: NURSE PRACTITIONER

## 2022-08-31 VITALS — Ht 76.0 in | Wt 315.0 lb

## 2022-08-31 DIAGNOSIS — J342 Deviated nasal septum: Secondary | ICD-10-CM

## 2022-08-31 DIAGNOSIS — R519 Headache, unspecified: Secondary | ICD-10-CM

## 2022-08-31 DIAGNOSIS — J343 Hypertrophy of nasal turbinates: Secondary | ICD-10-CM

## 2022-08-31 DIAGNOSIS — J3089 Other allergic rhinitis: Secondary | ICD-10-CM

## 2022-08-31 DIAGNOSIS — J309 Allergic rhinitis, unspecified: Secondary | ICD-10-CM

## 2022-08-31 NOTE — Progress Notes (Signed)
ENT, [facility]  [address]  [place] [address]  Phone: [phone]  Fax: [phone]      Encounter Date: 08/31/2022    Patient ID: Mark Patton  MRN: [id number]    DOB: 06-Sep-1990  Age: 32 y.o. male     Progress Note       Referring Provider:  No ref. provider found    Reason for Visit:   Chief Complaint   Patient presents with    Sinus Problem     1 mo rc, patient states he is still having nasal congestion R nostril, PND and cough. Pt using Qnasl and Azelastine. Pt currently on Augmentin x 3 days x URI        History of Present Illness:  Mark Patton is a 32 y.o. male follow up allergic rhinitis/sinusitis.  He states continues to have sinus pressure and nasal congestion daily.    CT sinuses right NSD and turbinate hypertrophy, sinuses were clear on 07/14/22      Patient History:  There is no problem list on file for this patient.    Current Outpatient Medications   Medication Sig    amoxicillin-pot clavulanate (AUGMENTIN) 875-125 mg Oral Tablet     azelastine (ASTELIN) 137 mcg (0.1 %) Nasal Aerosol, Spray Administer 1 Spray into each nostril Twice daily Use in each nostril as directed    Benzonatate (TESSALON) 200 mg Oral Capsule     busPIRone (BUSPAR) 5 mg Oral Tablet Take 1 tablet twice a day by oral route for 90 days.    DULoxetine (CYMBALTA DR) 60 mg Oral Capsule, Delayed Release(E.C.)       No Known Allergies  No past medical history on file.  Past Surgical History:   Procedure Laterality Date    HX HEART CATHETERIZATION       Family Medical History:    None         Social History     Tobacco Use    Smoking status: Never    Smokeless tobacco: Current   Substance Use Topics    Alcohol use: Not Currently    Drug use: Never       Review of Systems     Vitals:    08/31/22 1019   Weight: (!) 143 kg (315 lb)   Height: 1.93 m (6\' 4" )   BMI: 38.42      ENT Physical Exam  Constitutional  Appearance: patient appears well-developed, well-nourished and well-groomed,  Communication/Voice:  communication appropriate for developmental age; vocal quality normal;  Head and Face  Appearance: head appears normal, face appears normal and face appears atraumatic;  Palpation: facial palpation normal;  Salivary: glands normal;  Ear  Hearing: intact;  Auricles: right auricle normal; left auricle normal;  External Mastoids: right external mastoid normal; left external mastoid normal;  Ear Canals: right ear canal normal; left ear canal normal;  Tympanic Membranes: right tympanic membrane normal; left tympanic membrane normal;  Nose  External Nose: nares patent bilaterally; external nose normal;  Internal Nose: nasal mucosa normal; nasal septal deviation present; bilateral inferior turbinates with hypertrophy;  Oral Cavity/Oropharynx  Lips: normal;  Teeth: normal;  Gums: gingiva normal;  Tongue: normal;  Oral mucosa: normal;  Hard palate: normal;  Soft palate: normal;  Tonsils: bilateral tonsils 2+, cryptic;  Base of Tongue: normal;  Posterior pharyngeal wall: normal;  Neck  Neck: neck normal; neck palpation normal;  Thyroid: thyroid normal;  Respiratory  Inspection: breathing unlabored; normal breathing rate;  Lymphatic  Palpation: no cervical adenopathy noted;  Neurovestibular  Mental Status: alert and oriented;  Psychiatric: mood normal; affect is appropriate;  Cranial Nerves: cranial nerves intact;       Assessment:  ENCOUNTER DIAGNOSES     ICD-10-CM   1. Allergic rhinitis, unspecified seasonality, unspecified trigger  J30.9   2. Headache  R51.9   3. Nasal septal deviation  J34.2   4. Nasal turbinate hypertrophy  J34.3       Plan:  Medical records reviewed on 08/31/2022.  Allergy testing at follow up  Nasal endoscopy shows no acute sinusitis findings  He is considering Septoplasty and turbinate surgery      Orders Placed This Encounter    16109 - NASAL ENDOSCOPY DIAGNOSTIC UNILATERAL OR BILATERAL (AMB ONLY)     No follow-ups on file.    Elnora Morrison, FNP-BC  08/31/2022, 10:26

## 2022-09-07 ENCOUNTER — Encounter (INDEPENDENT_AMBULATORY_CARE_PROVIDER_SITE_OTHER): Payer: Self-pay | Admitting: NURSE PRACTITIONER

## 2022-09-07 ENCOUNTER — Ambulatory Visit (INDEPENDENT_AMBULATORY_CARE_PROVIDER_SITE_OTHER): Payer: Self-pay

## 2022-09-07 ENCOUNTER — Other Ambulatory Visit: Payer: Self-pay

## 2022-09-07 ENCOUNTER — Ambulatory Visit (INDEPENDENT_AMBULATORY_CARE_PROVIDER_SITE_OTHER): Payer: Self-pay | Admitting: NURSE PRACTITIONER

## 2022-09-07 VITALS — Ht 76.0 in | Wt 315.0 lb

## 2022-09-07 DIAGNOSIS — Z91038 Other insect allergy status: Secondary | ICD-10-CM

## 2022-09-07 DIAGNOSIS — J3089 Other allergic rhinitis: Secondary | ICD-10-CM

## 2022-09-07 DIAGNOSIS — J343 Hypertrophy of nasal turbinates: Secondary | ICD-10-CM

## 2022-09-07 DIAGNOSIS — J3081 Allergic rhinitis due to animal (cat) (dog) hair and dander: Secondary | ICD-10-CM

## 2022-09-07 DIAGNOSIS — J309 Allergic rhinitis, unspecified: Secondary | ICD-10-CM

## 2022-09-07 DIAGNOSIS — J342 Deviated nasal septum: Secondary | ICD-10-CM

## 2022-09-07 NOTE — Progress Notes (Unsigned)
ENT, [facility]  [address]  [place] [address]  Phone: [phone]  Fax: [phone]      Encounter Date: 09/07/2022    Patient ID: Mark Patton  MRN: [id number]    DOB: [birth year]  Age: 32 y.o. male     Progress Note       Referring Provider:  No ref. provider found    Reason for Visit:   Chief Complaint   Patient presents with    Follow-up After Testing     Rc after set testing, pt complains of nasal congestion        History of Present Illness:  Mark Patton is a 32 y.o. male follow up allergic rhinitis.  Here for allergy testing. He continues to having nasal congestion daily.  Has not used Afrin spray for 3-4 months.  No significant improvement with astelin or flunisolide spray      Patient History:  There is no problem list on file for this patient.    Current Outpatient Medications   Medication Sig    azelastine (ASTELIN) 137 mcg (0.1 %) Nasal Aerosol, Spray Administer 1 Spray into each nostril Twice daily Use in each nostril as directed    busPIRone (BUSPAR) 5 mg Oral Tablet Take 1 tablet twice a day by oral route for 90 days.    DULoxetine (CYMBALTA DR) 60 mg Oral Capsule, Delayed Release(E.C.)       No Known Allergies  No past medical history on file.  Past Surgical History:   Procedure Laterality Date    HX HEART CATHETERIZATION       Family Medical History:    None         Social History     Tobacco Use    Smoking status: Never    Smokeless tobacco: Current   Substance Use Topics    Alcohol use: Not Currently    Drug use: Never       Review of Systems     Vitals:    09/07/22 1133   Weight: (!) 143 kg (315 lb)   Height: 1.93 m ( )   BMI: 38.42      ENT Physical Exam  Constitutional  Appearance: patient appears well-developed, well-nourished and well-groomed,  Communication/Voice: communication appropriate for developmental age; vocal quality normal;  Head and Face  Appearance: head appears normal, face appears normal and face appears atraumatic;  Palpation: facial palpation  normal;  Salivary: glands normal;  Ear  Hearing: intact;  Auricles: right auricle normal; left auricle normal;  External Mastoids: right external mastoid normal; left external mastoid normal;  Ear Canals: right ear canal normal; left ear canal normal;  Tympanic Membranes: right tympanic membrane normal; left tympanic membrane normal;  Nose  External Nose: nares patent bilaterally; external nose normal;  Internal Nose: nasal mucosa normal; nasal septal deviation present; bilateral inferior turbinates with hypertrophy;  Oral Cavity/Oropharynx  Lips: normal;  Teeth: normal;  Gums: gingiva normal;  Tongue: normal;  Oral mucosa: normal;  Hard palate: normal;  Soft palate: normal;  Tonsils: bilateral tonsils 2+, cryptic;  Base of Tongue: normal;  Posterior pharyngeal wall: normal;  Neck  Neck: neck normal; neck palpation normal;  Thyroid: thyroid normal;  Respiratory  Inspection: breathing unlabored; normal breathing rate;  Lymphatic  Palpation: no cervical adenopathy noted;  Neurovestibular  Mental Status: alert and oriented;  Psychiatric: mood normal; affect is appropriate;  Cranial Nerves: cranial nerves intact;       Assessment:  ENCOUNTER DIAGNOSES  ICD-10-CM   1. Allergic rhinitis, unspecified seasonality, unspecified trigger  J30.9   2. Nasal septal deviation  J34.2   3. Nasal turbinate hypertrophy  J34.3       Plan:  Medical records reviewed on 09/07/2022.  Discussed allergy test results with patient.  Minimal allergens were identified  He has considered Septoplasty and turbinate surgery and would like to proceed with considering surgery.  Will follow up with Dr. Elissa Hefty for further evaluation.  a  No orders of the defined types were placed in this encounter.    No follow-ups on file.    Elnora Morrison, FNP-BC  09/07/2022, 11:40

## 2022-09-07 NOTE — Nursing Note (Signed)
09/07/22 1100   Allergy Testing   Is the patient on a beta blocker? No   Does the patient have asthma? No   Site    Testing site 1 Right Side   [place] Grass   [place] Grass Dilution 4 5   [place] Grass Dilution 2 5   [place] Grass Total Single/Multiple Stick: 2   Timothy Grass   Timothy Grass Dilution 4 5   Timothy Grass Dilution 2 5   Timothy Grass Total Single/Multiple Stick: 2   [place] Dilution 4 5   [place] Dilution 2 5   [place] Total Single/Multiple Stick: 2   Ragweed   Ragweed Dilution 4 5   Ragweed Dilution 2 6   Ragweed Total Single/Multiple Stick: 2   Mugwort   Mugwort Dilution 4 5   Mugwort Dilution 2 6   Mugwort Total Single/Multiple Stick: 2   [place] Thistle   [place] Thistle Dilution 4 5   Russian Thistle Dilution 2 5   Russian Thistle Total Single/Multiple Stick: 2   Maple (Box Elder)   Maple Dilution 4 5   Maple Dilution 2 5   Maple Total Single/Multiple Stick: 2   Red Oak   [employer]  Dilution 4 5   Red Oak  Dilution 2 5   Red Oak Total Single/Multiple Stick: 2   American Sycamore   American Sycamore Dilution 4 5   American Sycamore Dilution 2 5   American Sycamore Total Single/Multiple Stick: 2   Pine   Pine Dilution 4 5   Pine Dilution 3 5   Pine Dilution 2 8   TOTAL PINE DILUTION @ 3   Pine Total Single/Multiple Stick: 3   Cladosporium Sphaerospermum   Cladosporium Sphaerospermum Dilution 4 5   Cladosporium Sphaerospermum Dilution 2 5   Cladosporium Sphaerospermum Total Single/Multiple Stick: 2   Aspergillus   Aspergillus Dilution 4 5   Aspergillus Dilution 2 5   Aspergillus Total Single/Multiple Stick: 2   Alternaria   Alternaria Dilution 4 5   Alternaria Dilution 2 5   Alternaria Total Single/Multiple Stick: 2   Candida   Candida Dilution 4 5   Candida Dilution 2 5   Candida Total Single/Multiple Stick: 2   Epidermophyton   Epidermophyton Dilution 4 5   Epidermophyton Dilution 3 5   Epidermophyton Dilution 2 8   TOTAL EPIDERMOPHYTON DILUTION @ 3   Epidermophyton Total Single/Multiple  Stick: 3   Sarocladium Strictum   Sarocladium Strictum Dilution 4 5   Sarocladium Strictum Dilution 2 5   Sarocladium Strictum Total Single/Multiple Stick: 2   Bipolaris Sorokinina   Bipolaris Sorokiniana Dilution 4 5   Bipolaris [place]okin[place] Dilution 2 5   Bipolaris [place]okin[place] Total Single/Multiple Stick: 2   Penicillium   Penicillium Dilution 4 5   Penicillium Dilution 2 5   Penicillium Total Single/Multiple Stick: 2   Corn Smut   Corn Smut Dilution 4 5   Corn Smut Dilution 2 5   Corn Smut Total Single/Multiple Stick: 2   Aureobasidium Pullulans   Aureobasidium Pullulans Dilution 4 5   Aureobasidium Pullulans Dilution 2 5   Aureobasidium Pullulans Total Single/Multiple Stick: 2   Gibberella Pulicaris   Gibberella Pulicaris Dilution 4 5   Gibberella Pulicaris Dilution 2 5   Gibberella Pulicaris Total Single/Multiple Stick: 2   Epicoccum   Epicoccum Dilution 4 5   Epicoccum Dilution 2 5   Epicoccum Total Single/Multiple Stick: 2   Mucor  Mucor Dilution 4 5   Mucor Dilution 2 5   Mucor Total Single/Multiple Stick: 2   Cockroach   Cockroach Dilution 4 5   Cockroach Dilution 3 5   Cockroach Dilution 2 8   TOTAL COCKROACH DILUTION @ 3   Cockroach Total Single/Multiple Stick: 3   Cat   Cat Dilution 4 5   Cat Dilution 3 5   Cat Dilution 2 7   TOTAL CAT DILUTION @ 2   Cat Total Single/Multiple Stick: 3   Dog   Dog Dilution 4 5   Dog Dilution 3 6   Dog Dilution 2 8   TOTAL DOG DILUTION @ 3   Dog Total Single/Multiple Stick: 3   Rabbit   Rabbit Total Single/Multiple Stick: 3   Dustmite Farinae   Dustmite Farinae Dilution 4 6   Dustmite Farinae Dilution 3 9   TOTAL DUSTMITE FARINAE DILUTION @ 4   Dustmite Farinae Total Single/Multiple Stick: 2   Dustmite Pteronyssinus   Dustmite Pteronyssinus Dilution 4 6   Dustmite Pteronyssinus Dilution 3 9   TOTAL DUSTMITE PTERONYSSINUS DILUTION @ 4   Dustmite Pteronyssinus Total Single/Multiple Stick: 2   Histamine    Histamine 7   Initials   Initials rs   Rita Southern, LPN  I have  reviewed the above allergy test results which were negative, and will pursue treatment accordingly.  Elnora Morrison, FNP-BC

## 2022-09-07 NOTE — Progress Notes (Signed)
See SET results.

## 2022-10-05 ENCOUNTER — Encounter (INDEPENDENT_AMBULATORY_CARE_PROVIDER_SITE_OTHER): Payer: Self-pay | Admitting: OTOLARYNGOLOGY

## 2022-10-05 ENCOUNTER — Other Ambulatory Visit: Payer: Self-pay

## 2022-10-05 ENCOUNTER — Other Ambulatory Visit (INDEPENDENT_AMBULATORY_CARE_PROVIDER_SITE_OTHER): Payer: Self-pay | Admitting: OTOLARYNGOLOGY

## 2022-10-05 ENCOUNTER — Ambulatory Visit (INDEPENDENT_AMBULATORY_CARE_PROVIDER_SITE_OTHER): Admitting: OTOLARYNGOLOGY

## 2022-10-05 VITALS — Ht 76.0 in | Wt 315.0 lb

## 2022-10-05 DIAGNOSIS — R519 Headache, unspecified: Secondary | ICD-10-CM

## 2022-10-05 DIAGNOSIS — J342 Deviated nasal septum: Secondary | ICD-10-CM

## 2022-10-05 DIAGNOSIS — J329 Chronic sinusitis, unspecified: Secondary | ICD-10-CM

## 2022-10-05 DIAGNOSIS — M950 Acquired deformity of nose: Secondary | ICD-10-CM

## 2022-10-05 DIAGNOSIS — J343 Hypertrophy of nasal turbinates: Secondary | ICD-10-CM

## 2022-10-05 NOTE — Addendum Note (Signed)
Addended by: Charlesetta Ivory on: 10/05/2022 10:23 AM     Modules accepted: Orders

## 2022-10-05 NOTE — H&P (Signed)
[facility], [facility]  [address]  [place] [address]    Progress Note    Name: Mark Patton MRN:  [id number]   Date: 10/05/2022 DOB:  08/07/1990 (32 y.o.)              Follow Up      Subjective:   Chief Complaint:   Deviated Septum (Complains of nasal congestion and headaches. Wanting to discuss septoplasty.)       History of Present Illness:  Mark Patton is a 32 y.o. old male who presents to the clinic for follow-up. Patient states that he continues to have facial pain, headache, and nasal congestion.      Review of Systems     Physical Exam:     Vitals:    10/05/22 0928   Weight: (!) 143 kg (315 lb)   Height: 1.93 m (6\' 4" )   BMI: 38.42      ENT Physical Exam  Constitutional  Appearance: patient appears well-developed, well-nourished and well-groomed,  Communication/Voice: communication appropriate for developmental age; vocal quality normal;  Head and Face  Appearance: head appears normal, face appears normal and face appears atraumatic;  Palpation: facial palpation normal;  Salivary: glands normal;  Ear  Hearing: intact;  Auricles: right auricle normal; left auricle normal;  External Mastoids: right external mastoid normal; left external mastoid normal;  Ear Canals: right ear canal normal; left ear canal normal;  Tympanic Membranes: right tympanic membrane normal; left tympanic membrane normal;  Nose  External Nose: nares patent bilaterally; external nose normal;  Internal Nose: nasal obstruction present; nasal septal deviation present; bilateral inferior turbinates erythematous; with hypertrophy;  Nose comments: +modified cottle maneuver.   Oral Cavity/Oropharynx  Lips: normal;  Teeth: normal;  Gums: gingiva normal;  Tongue: normal;  Oral mucosa: normal;  Hard palate: normal;  Soft palate: normal;  Tonsils: normal;  Base of Tongue: normal;  Posterior pharyngeal wall: normal;  Neck  Neck: neck normal; neck palpation normal;  Thyroid: thyroid normal;  Respiratory  Inspection: breathing  unlabored; normal breathing rate;  Lymphatic  Palpation: lymph nodes normal;  Neurovestibular  Mental Status: alert and oriented;  Psychiatric: mood normal; affect is appropriate;  Cranial Nerves: cranial nerves intact;       Assessment and Plan:       ICD-10-CM    1. Nonintractable headache, unspecified chronicity pattern, unspecified headache type  R51.9       2. Facial pain  R51.9       3. Nasal septal deviation  J34.2 31231 - NASAL ENDOSCOPY DIAGNOSTIC UNILATERAL OR BILATERAL (AMB ONLY)      4. Nasal turbinate hypertrophy  J34.3       5. 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 Recurrent sinusitis  J32.9         Orders Placed This Encounter    47829 - NASAL ENDOSCOPY DIAGNOSTIC UNILATERAL OR BILATERAL (AMB ONLY)     Treatment options discussed with patient and he wishes to proceed with FESS, Septo, turbs, NVR. Risks and benefits were discussed with patient.  Risks involved:  Operations upon the Nose or Sinuses:  perforation of the nasal septum with crust formation in nose, swelling of nose, cosmetic change in nose, injury to teeth, injury to eyes including blindness, bruising of eyelids, numbness in parts of face, excessive tearing, leakage of brain fluid, inflammation or infection of the coverings of the brain, leakage between the sinuses and mouth, impaired sense of smell.  Follow up:  Return for Follow up for surgery.    Dia Sitter, DO

## 2022-10-05 NOTE — Procedures (Signed)
[facility], [facility]  [address]  [place] [address]    Procedure Note    Name: Mark Patton MRN:  [id number]   Date: 10/05/2022 DOB:  Aug 01, 1990 (31 y.o.)         31231 - NASAL ENDOSCOPY DIAGNOSTIC UNILATERAL OR BILATERAL (AMB ONLY)    Performed by: Conchita Paris, DO  Authorized by: Conchita Paris, DO    Time Out:     Immediately before the procedure, a time out was called:  Yes    Patient verified:  Yes    Procedure Verified:  Yes    Site Verified:  Yes  Documentation:      [facility], [facility]  [address] [place] [address]    Procedure Note    Name: Mark Patton MRN:  [id number]  Date: 10/05/2022 DOB:  1991/03/11 (31 y.o.)        @PROCDOC @    Indications for procedure: Obstructive nasal breathing    Anesthesia: Oxymetazoline nasal spray    Description: Nasal endoscopy with rigid scope was performed with examination of the  septum, inferior, middle, and superior meatus, turbinates, sphenoethmoidal recess, and nasopharynx.     There were no polyps, pus, or granulation tissue noted.  ET orifices and nasopharynx were normal.     Findings: Septal deviation    The patient tolerated the procedure well.    Conchita Paris, DO             Salma Walrond Gu [place], DO

## 2022-10-18 ENCOUNTER — Encounter (INDEPENDENT_AMBULATORY_CARE_PROVIDER_SITE_OTHER): Payer: Self-pay | Admitting: OTOLARYNGOLOGY

## 2022-11-06 ENCOUNTER — Encounter (HOSPITAL_COMMUNITY): Payer: Self-pay | Admitting: OTOLARYNGOLOGY

## 2022-11-08 ENCOUNTER — Other Ambulatory Visit: Payer: Self-pay

## 2022-11-08 ENCOUNTER — Ambulatory Visit (HOSPITAL_COMMUNITY): Admitting: Certified Registered"

## 2022-11-08 ENCOUNTER — Encounter (HOSPITAL_COMMUNITY): Payer: Self-pay | Admitting: OTOLARYNGOLOGY

## 2022-11-08 ENCOUNTER — Inpatient Hospital Stay
Admission: RE | Admit: 2022-11-08 | Discharge: 2022-11-08 | Disposition: A | Discharge status: Home / Self Care | Source: Ambulatory Visit | Attending: OTOLARYNGOLOGY | Admitting: OTOLARYNGOLOGY

## 2022-11-08 ENCOUNTER — Encounter (HOSPITAL_COMMUNITY)
Admission: RE | Disposition: A | Discharge status: Home / Self Care | Payer: Self-pay | Source: Ambulatory Visit | Attending: OTOLARYNGOLOGY

## 2022-11-08 DIAGNOSIS — Z6838 Body mass index (BMI) 38.0-38.9, adult: Secondary | ICD-10-CM | POA: Insufficient documentation

## 2022-11-08 DIAGNOSIS — J342 Deviated nasal septum: Secondary | ICD-10-CM | POA: Insufficient documentation

## 2022-11-08 DIAGNOSIS — J343 Hypertrophy of nasal turbinates: Secondary | ICD-10-CM | POA: Insufficient documentation

## 2022-11-08 DIAGNOSIS — E785 Hyperlipidemia, unspecified: Secondary | ICD-10-CM | POA: Insufficient documentation

## 2022-11-08 DIAGNOSIS — F419 Anxiety disorder, unspecified: Secondary | ICD-10-CM | POA: Insufficient documentation

## 2022-11-08 DIAGNOSIS — J329 Chronic sinusitis, unspecified: Secondary | ICD-10-CM

## 2022-11-08 DIAGNOSIS — J3489 Other specified disorders of nose and nasal sinuses: Secondary | ICD-10-CM | POA: Insufficient documentation

## 2022-11-08 DIAGNOSIS — E669 Obesity, unspecified: Secondary | ICD-10-CM | POA: Insufficient documentation

## 2022-11-08 HISTORY — DX: Presence of spectacles and contact lenses: Z97.3

## 2022-11-08 HISTORY — DX: Anxiety disorder, unspecified: F41.9

## 2022-11-08 HISTORY — DX: Pericarditis in diseases classified elsewhere: I32

## 2022-11-08 LAB — LAVENDER TOP TUBE

## 2022-11-08 LAB — PT/INR
INR: 1.06 (ref 0.84–1.10)
PROTHROMBIN TIME: 12.4 seconds (ref 9.8–12.7)

## 2022-11-08 LAB — LIGHT GREEN TOP TUBE

## 2022-11-08 LAB — PTT (PARTIAL THROMBOPLASTIN TIME): APTT: 32.5 seconds (ref 25.0–38.0)

## 2022-11-08 SURGERY — ENDOSCOPIC SINUS SURGERY
Anesthesia: General | Site: Nose | Wound class: Clean Contaminated Wounds-The respiratory, GI, Genital, or urinary

## 2022-11-08 MED ORDER — CEFAZOLIN 2 GRAM INTRAVENOUS SOLUTION
INTRAVENOUS | Status: AC
Start: 2022-11-08 — End: 2022-11-08
  Filled 2022-11-08: qty 14.71

## 2022-11-08 MED ORDER — IPRATROPIUM 0.5 MG-ALBUTEROL 3 MG (2.5 MG BASE)/3 ML NEBULIZATION SOLN
3.0000 mL | INHALATION_SOLUTION | Freq: Once | RESPIRATORY_TRACT | Status: DC | PRN
Start: 2022-11-08 — End: 2022-11-08

## 2022-11-08 MED ORDER — ACETAMINOPHEN 1,000 MG/100 ML (10 MG/ML) INTRAVENOUS SOLUTION
INTRAVENOUS | Status: AC
Start: 2022-11-08 — End: 2022-11-08
  Filled 2022-11-08: qty 100

## 2022-11-08 MED ORDER — SODIUM CHLORIDE 0.9 % (FLUSH) INJECTION SYRINGE
3.0000 mL | INJECTION | INTRAMUSCULAR | Status: DC | PRN
Start: 2022-11-08 — End: 2022-11-08

## 2022-11-08 MED ORDER — FENTANYL (PF) 50 MCG/ML INJECTION SOLUTION
INTRAMUSCULAR | Status: AC
Start: 2022-11-08 — End: 2022-11-08
  Filled 2022-11-08: qty 2

## 2022-11-08 MED ORDER — HYDROCODONE 5 MG-ACETAMINOPHEN 325 MG TABLET
1.0000 | ORAL_TABLET | ORAL | 0 refills | Status: DC | PRN
Start: 2022-11-08 — End: 2022-11-16

## 2022-11-08 MED ORDER — MORPHINE 4 MG/ML INJECTION WRAPPER
4.0000 mg | INJECTION | Freq: Once | INTRAMUSCULAR | Status: AC | PRN
Start: 2022-11-08 — End: 2022-11-08
  Administered 2022-11-08: 4 mg via INTRAVENOUS
  Filled 2022-11-08: qty 1

## 2022-11-08 MED ORDER — FENTANYL (PF) 50 MCG/ML INJECTION WRAPPER
INJECTION | Freq: Once | INTRAMUSCULAR | Status: DC | PRN
Start: 2022-11-08 — End: 2022-11-08
  Administered 2022-11-08: 25 ug via INTRAVENOUS
  Administered 2022-11-08 (×3): 50 ug via INTRAVENOUS
  Administered 2022-11-08: 25 ug via INTRAVENOUS

## 2022-11-08 MED ORDER — PROPOFOL 10 MG/ML IV BOLUS
INJECTION | Freq: Once | INTRAVENOUS | Status: DC | PRN
Start: 2022-11-08 — End: 2022-11-08
  Administered 2022-11-08: 200 mg via INTRAVENOUS

## 2022-11-08 MED ORDER — FENTANYL (PF) 50 MCG/ML INJECTION WRAPPER
50.0000 ug | INJECTION | INTRAMUSCULAR | Status: DC | PRN
Start: 2022-11-08 — End: 2022-11-08

## 2022-11-08 MED ORDER — FAMOTIDINE (PF) 20 MG/2 ML INTRAVENOUS SOLUTION
INTRAVENOUS | Status: AC
Start: 2022-11-08 — End: 2022-11-08
  Filled 2022-11-08: qty 2

## 2022-11-08 MED ORDER — MUPIROCIN 2 % TOPICAL OINTMENT
TOPICAL_OINTMENT | CUTANEOUS | Status: AC
Start: 2022-11-08 — End: 2022-11-08
  Filled 2022-11-08: qty 44

## 2022-11-08 MED ORDER — COCAINE 4 % NASAL SOLUTION
Freq: Once | NASAL | Status: DC | PRN
Start: 2022-11-08 — End: 2022-11-08
  Administered 2022-11-08: 4 mL via TOPICAL

## 2022-11-08 MED ORDER — DEXAMETHASONE SODIUM PHOSPHATE 4 MG/ML INJECTION SOLUTION
INTRAMUSCULAR | Status: AC
Start: 2022-11-08 — End: 2022-11-08
  Filled 2022-11-08: qty 1

## 2022-11-08 MED ORDER — MIDAZOLAM 5 MG/ML INJECTION WRAPPER
2.0000 mg | Freq: Once | INTRAMUSCULAR | Status: DC | PRN
Start: 2022-11-08 — End: 2022-11-08
  Administered 2022-11-08: 2 mg via INTRAVENOUS

## 2022-11-08 MED ORDER — LIDOCAINE 1 %-EPINEPHRINE 1:100,000 INJECTION SOLUTION
Freq: Once | INTRAMUSCULAR | Status: DC | PRN
Start: 2022-11-08 — End: 2022-11-08
  Administered 2022-11-08: 14 mL via INTRAMUSCULAR

## 2022-11-08 MED ORDER — SODIUM CHLORIDE 0.9 % INTRAVENOUS PIGGYBACK
INJECTION | INTRAVENOUS | Status: AC
Start: 2022-11-08 — End: 2022-11-08
  Filled 2022-11-08: qty 50

## 2022-11-08 MED ORDER — OXYMETAZOLINE 0.05 % NASAL SPRAY
2.0000 | Freq: Once | NASAL | Status: DC
Start: 2022-11-08 — End: 2022-11-08

## 2022-11-08 MED ORDER — ONDANSETRON HCL (PF) 4 MG/2 ML INJECTION SOLUTION
4.0000 mg | Freq: Once | INTRAMUSCULAR | Status: AC
Start: 2022-11-08 — End: 2022-11-08
  Administered 2022-11-08: 4 mg via INTRAVENOUS

## 2022-11-08 MED ORDER — PROCHLORPERAZINE EDISYLATE 10 MG/2 ML (5 MG/ML) INJECTION SOLUTION
5.0000 mg | Freq: Once | INTRAMUSCULAR | Status: DC | PRN
Start: 2022-11-08 — End: 2022-11-08

## 2022-11-08 MED ORDER — ONDANSETRON HCL (PF) 4 MG/2 ML INJECTION SOLUTION
4.0000 mg | Freq: Once | INTRAMUSCULAR | Status: DC | PRN
Start: 2022-11-08 — End: 2022-11-08

## 2022-11-08 MED ORDER — LACTATED RINGERS INTRAVENOUS SOLUTION
INTRAVENOUS | Status: DC
Start: 2022-11-08 — End: 2022-11-08

## 2022-11-08 MED ORDER — CEFAZOLIN 1 GRAM SOLUTION FOR INJECTION
INTRAMUSCULAR | Status: AC
Start: 2022-11-08 — End: 2022-11-08
  Filled 2022-11-08: qty 10

## 2022-11-08 MED ORDER — ACETAMINOPHEN 1,000 MG/100 ML (10 MG/ML) INTRAVENOUS SOLUTION
Freq: Once | INTRAVENOUS | Status: DC | PRN
Start: 2022-11-08 — End: 2022-11-08
  Administered 2022-11-08: 1000 mg via INTRAVENOUS

## 2022-11-08 MED ORDER — CEFAZOLIN 1 GRAM SOLUTION FOR INJECTION
Freq: Once | INTRAMUSCULAR | Status: DC | PRN
Start: 2022-11-08 — End: 2022-11-08
  Administered 2022-11-08: 3000 mg via INTRAVENOUS

## 2022-11-08 MED ORDER — OXYMETAZOLINE 0.05 % NASAL SPRAY
NASAL | Status: AC
Start: 2022-11-08 — End: 2022-11-08
  Filled 2022-11-08: qty 30

## 2022-11-08 MED ORDER — ROCURONIUM 10 MG/ML INTRAVENOUS SOLUTION
Freq: Once | INTRAVENOUS | Status: DC | PRN
Start: 2022-11-08 — End: 2022-11-08
  Administered 2022-11-08: 20 mg via INTRAVENOUS
  Administered 2022-11-08: 50 mg via INTRAVENOUS

## 2022-11-08 MED ORDER — ALBUTEROL SULFATE 2.5 MG/3 ML (0.083 %) SOLUTION FOR NEBULIZATION
2.5000 mg | INHALATION_SOLUTION | Freq: Once | RESPIRATORY_TRACT | Status: DC | PRN
Start: 2022-11-08 — End: 2022-11-08

## 2022-11-08 MED ORDER — LIDOCAINE (PF) 100 MG/5 ML (2 %) INTRAVENOUS SYRINGE
INJECTION | Freq: Once | INTRAVENOUS | Status: DC | PRN
Start: 2022-11-08 — End: 2022-11-08
  Administered 2022-11-08: 100 mg via INTRAVENOUS

## 2022-11-08 MED ORDER — COCAINE 4 % NASAL SOLUTION
NASAL | Status: AC
Start: 2022-11-08 — End: 2022-11-08
  Filled 2022-11-08: qty 4

## 2022-11-08 MED ORDER — ONDANSETRON HCL (PF) 4 MG/2 ML INJECTION SOLUTION
INTRAMUSCULAR | Status: AC
Start: 2022-11-08 — End: 2022-11-08
  Filled 2022-11-08: qty 2

## 2022-11-08 MED ORDER — SODIUM CHLORIDE 0.9 % (FLUSH) INJECTION SYRINGE
3.0000 mL | INJECTION | Freq: Three times a day (TID) | INTRAMUSCULAR | Status: DC
Start: 2022-11-08 — End: 2022-11-08

## 2022-11-08 MED ORDER — DEXAMETHASONE SODIUM PHOSPHATE 4 MG/ML INJECTION SOLUTION
4.0000 mg | Freq: Once | INTRAMUSCULAR | Status: AC
Start: 2022-11-08 — End: 2022-11-08
  Administered 2022-11-08: 4 mg via INTRAVENOUS

## 2022-11-08 MED ORDER — FENTANYL (PF) 50 MCG/ML INJECTION WRAPPER
25.0000 ug | INJECTION | INTRAMUSCULAR | Status: DC | PRN
Start: 2022-11-08 — End: 2022-11-08

## 2022-11-08 MED ORDER — MUPIROCIN 2 % TOPICAL OINTMENT
TOPICAL_OINTMENT | Freq: Once | CUTANEOUS | Status: DC | PRN
Start: 2022-11-08 — End: 2022-11-08
  Administered 2022-11-08: 1 via TOPICAL

## 2022-11-08 MED ORDER — SODIUM CHLORIDE 0.65 % NASAL SPRAY AEROSOL
2.0000 | INHALATION_SPRAY | Freq: Two times a day (BID) | NASAL | Status: DC
Start: 2022-11-08 — End: 2022-11-08

## 2022-11-08 MED ORDER — FAMOTIDINE (PF) 20 MG/2 ML INTRAVENOUS SOLUTION
20.0000 mg | Freq: Once | INTRAVENOUS | Status: AC
Start: 2022-11-08 — End: 2022-11-08
  Administered 2022-11-08: 20 mg via INTRAVENOUS

## 2022-11-08 MED ORDER — HYDROCODONE 7.5 MG-ACETAMINOPHEN 325 MG/15 ML ORAL SOLUTION
7.5000 mg | Freq: Four times a day (QID) | ORAL | Status: DC | PRN
Start: 2022-11-08 — End: 2022-11-08
  Administered 2022-11-08: 7.5 mg via ORAL
  Filled 2022-11-08: qty 15

## 2022-11-08 MED ORDER — LIDOCAINE 1 %-EPINEPHRINE 1:100,000 INJECTION SOLUTION
INTRAMUSCULAR | Status: AC
Start: 2022-11-08 — End: 2022-11-08
  Filled 2022-11-08: qty 50

## 2022-11-08 MED ORDER — ONDANSETRON HCL (PF) 4 MG/2 ML INJECTION SOLUTION
4.0000 mg | INTRAMUSCULAR | Status: DC | PRN
Start: 2022-11-08 — End: 2022-11-08

## 2022-11-08 MED ORDER — SODIUM CHLORIDE 0.65 % NASAL SPRAY AEROSOL
2.0000 | INHALATION_SPRAY | Freq: Two times a day (BID) | NASAL | Status: DC
Start: 2022-11-08 — End: 2022-11-08
  Filled 2022-11-08: qty 44

## 2022-11-08 MED ORDER — SUGAMMADEX 100 MG/ML INTRAVENOUS SOLUTION
Freq: Once | INTRAVENOUS | Status: DC | PRN
Start: 2022-11-08 — End: 2022-11-08
  Administered 2022-11-08 (×4): 50 mg via INTRAVENOUS

## 2022-11-08 MED ORDER — MIDAZOLAM 5 MG/ML INJECTION WRAPPER
INTRAMUSCULAR | Status: AC
Start: 2022-11-08 — End: 2022-11-08
  Filled 2022-11-08: qty 1

## 2022-11-08 SURGICAL SUPPLY — 66 items
AGENT HMST THRMB STRL KIT MATRIX SURGIFLO 8ML (WOUND CARE SUPPLY) ×1 IMPLANT
AIRWAY 26FR 8.7MM 6.5MM PVC NASOPHAR SLIDABLE DEPTH RING SFT FLXB LF  FLX-TIP SLTR STRL DISP (WOUND CARE/ENTEROSTOMAL SUPPLY) ×1
BLADE 15 2 END CBNSTL SURG STRL DISP (SURGICAL CUTTING SUPPLIES) ×2 IMPLANT
BLADE SHAVER 11CM 2MM 60-3K RPM INFR TRBNT STR SHAFT ELEV IRRG TUBE NONROTATABLE SIN (ENDOSCOPIC SUPPLIES) ×1 IMPLANT
BLADE SHAVER 11CM 4MM RAD 40 XPS 40D 5K RPM CURVE OFST SIN CUT SURF IRRG TUBE OSCILLATE STRL (ENDOSCOPIC SUPPLIES) IMPLANT
BLADE SHAVER 11CM 4MM XPS TRCT 360D 5K RPM SIN M4 ROT STR SHAFT OFST CUT SURF IRRG TUBING OSCILLATE (ENDOSCOPIC SUPPLIES) ×1 IMPLANT
BLADE SHAVER 13CM 4MM TRCT M4 ROT (ENDOSCOPIC SUPPLIES) IMPLANT
COAG SUCT 6IN 10FR AL PLOLFN POLYPROP FOOTSWITCH MONOPOL STRL DISP (SURGICAL CUTTING SUPPLIES) IMPLANT
COAG SUCT 6IN 8FR VLAB HNDSWH CORD THERM REDUCT FLUID EVAC TUBE ENT STRL LF  DISP (SURGICAL CUTTING SUPPLIES) ×1 IMPLANT
CONTAINR HISTO C90ML 10% NEUT BF FRMLN POLYPROP PREFL 60ML (SPECIMEN COLLECTION SUPPLIES) ×1 IMPLANT
CONV USE 123874 - SYRINGE AMSURE MDCHC 60CC LF  STRL TIP PRTC SM TUBE ADPR IRRG DISC BULB POLYPROP (MED SURG SUPPLIES) ×1 IMPLANT
CONV USE ITEM 14358 - SUTURE SILK 2-0 C-15 SOFSILK 18IN BLK BRD COAT NONAB (SUTURE/WOUND CLOSURE) ×1 IMPLANT
CONV USE ITEM 47727 - SUTURE CHR 4-0 CV-23 30IN ABS (SUTURE/WOUND CLOSURE) IMPLANT
COUNTER 20 CNT BLOCK ADH NEEDLE STRL LF  RD SHARP FOAM 15.75X11.5X14IN DISP (MED SURG SUPPLIES) ×1 IMPLANT
DETERGENT INSTR 22OZ TRNSPT GEL RINSE FREE NEUT PH PREKLENZ CLR PLSNT LF (MISCELLANEOUS PT CARE ITEMS) ×1 IMPLANT
DEVICE CRYOSURGICAL CLARIFIX HANDHELD 1 CRYOPROBE 2 CAP CRYOGEN CAN CRYO STRL LF  DISP (MED SURG SUPPLIES) IMPLANT
DISCONTINUED NO SUB - AIRWAY 26FR 8.7MM 6.5MM PVC NASOPHAR SLIDABLE DEPTH RING SFT FLXB LF  FLX-TIP SLTR STRL DISP (WOUND CARE SUPPLY) ×1 IMPLANT
DISCONTINUED NO SUB - PACKING 4X4CM NASAL DRESS SIN STENT HDRT MRGL HYAFF (MED SURG SUPPLIES) ×1 IMPLANT
ELECTRODE ESURG BLADE PNCL 15FT VLAB EDGE TELESCP SMOKE EVAC (SURGICAL CUTTING SUPPLIES) IMPLANT
GLOVE SURG 5.5 LF  PF BEAD CUF STRL CRM 12IN PROTEXIS PI PLISPRN THK9.4 MIL (GLOVES AND ACCESSORIES) IMPLANT
GLOVE SURG 6 LF  PF BEAD CUF STRL CRM 11.3IN PROTEXIS PLISPRN THK9.1 MIL (GLOVES AND ACCESSORIES) IMPLANT
GLOVE SURG 6 LF  PF SMOOTH BEAD CUF INTLK STRL BLU 11.3IN PROTEXIS NEU-THERA PLISPRN THK7.9 MIL (GLOVES AND ACCESSORIES) IMPLANT
GLOVE SURG 6.5 LF  PF BEAD CUF STRL CRM 11.3IN PROTEXIS PI PLISPRN THK9.1 MIL (GLOVES AND ACCESSORIES) ×1 IMPLANT
GLOVE SURG 6.5 LF  PF SMOOTH BEAD CUF INTLK STRL BLU 11.3IN PROTEXIS NEU-THERA PLISPRN THK7.9 MIL (GLOVES AND ACCESSORIES) IMPLANT
GLOVE SURG 6.5 LTX PF SMOOTH BEAD CUF STRL YW 11.5IN PROTEXIS NEU-THERA DDRGL THK8.7 MIL (GLOVES AND ACCESSORIES) IMPLANT
GLOVE SURG 7 LF  PF BEAD CUF STRL CRM 11.8IN PROTEXIS PI PLISPRN THK9.1 MIL (GLOVES AND ACCESSORIES) IMPLANT
GLOVE SURG 7 LF  PF SMOOTH BEAD CUF INTLK STRL BLU 11.8IN PROTEXIS NEU-THERA PLISPRN THK7.9 MIL (GLOVES AND ACCESSORIES) IMPLANT
GLOVE SURG 7 LTX PF SMOOTH BEAD CUF STRL YW 12IN PROTEXIS NEU-THERA DDRGL THK8.7 MIL (GLOVES AND ACCESSORIES) IMPLANT
GLOVE SURG 7.5 LF  PF BEAD CUF STRL CRM 11.8IN PROTEXIS PI PLISPRN THK9.1 MIL (GLOVES AND ACCESSORIES) ×1 IMPLANT
GLOVE SURG 7.5 LF  PF SMOOTH BEAD CUF INTLK STRL BLU 11.8IN PROTEXIS NEU-THERA PLISPRN THK7.9 MIL (GLOVES AND ACCESSORIES) IMPLANT
GLOVE SURG 7.5 LTX PF SMOOTH BEAD CUF STRL YW 12IN PROTEXIS (GLOVES AND ACCESSORIES) IMPLANT
GLOVE SURG 8 LF  PF BEAD CUF STRL CRM 11.8IN PROTEXIS PI PLISPRN THK9.1 MIL (GLOVES AND ACCESSORIES) IMPLANT
GLOVE SURG 8 LF  PF SMOOTH BEAD CUF INTLK STRL BLU 11.8IN PROTEXIS NEU-THERA PLISPRN THK7.9 MIL (GLOVES AND ACCESSORIES) IMPLANT
GLOVE SURG 8.5 LF  PF BEAD CUF STRL CRM 11.8IN PROTEXIS PI PLISPRN THK9.1 MIL (GLOVES AND ACCESSORIES) IMPLANT
GOWN SURG XL STD LGTH L3 HKLP CLSR RGLN SLEEVE TWL STRL LF  DISP GRN AERO BLU PRFRM FBRC (DRAPE/PACKS/SHEETS/OR TOWEL) ×1 IMPLANT
HOLDER DRESS 16IN PLASTIC FBR ADJ EARLP ADH TAPE LF  NONST NASAL DISP CLR (WOUND CARE SUPPLY) ×1 IMPLANT
LABEL MED CORRECT MED LABELING SYS 4 FLG 2 SHEET 24 PRPRNT STRL (MED SURG SUPPLIES) ×1 IMPLANT
NEEDLE HYPO  18GA 1.5IN REG WL BD PRCSNGL POLYPROP REG BVL LL HUB CLR CD DEHP-FR STRL LF  DISP (MED SURG SUPPLIES) ×1 IMPLANT
NEEDLE HYPO  27GA 1.5IN REG WL PRCSNGL SS POLYPROP REG BVL LL HUB DEHP-FR GRY STRL LF  DISP (MED SURG SUPPLIES) ×2 IMPLANT
NEEDLE SPINAL BLU 3.5IN 25GA WHITACRE REG WL POLYPROP HIFLO PP TIP STRL LF  DISP (MED SURG SUPPLIES) ×1 IMPLANT
PACK SURG ECLIPSE ENT I STRL LF (CUSTOM TRAYS & PACK) ×1 IMPLANT
PACKING 4X4CM NASAL DRESS SIN STENT HDRT MRGL HYAFF (MED SURG SUPPLIES) ×1 IMPLANT
SHEATH ENDOS CLN INSTACLEAR 0D LEN CLNR UNI LIGHT STRZ STRL LF  DISP (ENDOSCOPIC SUPPLIES) ×1 IMPLANT
SOL ANFG DFGR ISOPRPNL PAD OVAL BTL NABRSV ADH STRL LF  DISP (ENDOSCOPIC SUPPLIES) ×1 IMPLANT
SOL IRRG 0.9% NACL 1000ML PLASTIC PR BTL ISTNC N-PYRG STRL LF (MEDICATIONS/SOLUTIONS) ×1 IMPLANT
SOL IRRG 0.9% NACL 2000ML PRSV FR N-PYRG FLXB CONTAINR STRL LF (MEDICATIONS/SOLUTIONS) IMPLANT
SOL IV 0.9% NACL 1000ML STRL PRSV FR FLXB CONTAINR LF (MEDICATIONS/SOLUTIONS) ×1 IMPLANT
SPLINT NASAL 5X1.5CM CHITOSAN DISSOLVABLE PLBL (WOUND CARE SUPPLY) ×1 IMPLANT
SPLINT NASAL SIL DOYLE SPT COMPRESS ANT  TIP INT AIRWAY STRL LF  WHT (WOUND CARE SUPPLY) ×1 IMPLANT
SPONGE ABS 12.5X8CM PORCINE GELTN SRGFM THK10MM STRL DISP (WOUND CARE SUPPLY) ×1 IMPLANT
SPONGE SURG 3X.5IN LOCATOR STNG CNT CRD RADOPQ PTT RYN STRL LF  DISP NEURO (MED SURG SUPPLIES) ×1 IMPLANT
SPONGE SURG 4X4IN 16 PLY XRY DETECT COTTON STRL LF  DISP (WOUND CARE SUPPLY) IMPLANT
SUTURE 4-0 P-3 MONOCRYL MTPS 18IN UNDYED MONOF ABS (SUTURE/WOUND CLOSURE) ×1 IMPLANT
SUTURE CHR 4-0 CV-23 30IN ABS (SUTURE/WOUND CLOSURE)
SUTURE CHR 5-0 P-13 18IN ABS (SUTURE/WOUND CLOSURE) ×1 IMPLANT
SUTURE PLAIN 4-0 SC-1 18IN TAN 2 ARM MONOF ABS (SUTURE/WOUND CLOSURE) ×1 IMPLANT
SUTURE SILK 2-0 C-15 SOFSILK 18IN BLK BRD COAT NONAB (SUTURE/WOUND CLOSURE) ×1
SYRINGE AMSURE MDCHC 60CC LF  STRL TIP PRTC SM TUBE ADPR IRRG DISC BULB POLYPROP (MED SURG SUPPLIES) ×1
SYRINGE ANGIO 8ML PLYCRB FIX MALE LL CONN GRIP SLD PLNG COR INJ8 RING (CONTRAST) ×3 IMPLANT
SYRINGE LL 5ML LF STRL GRAD N-PYRG DEHP-FR PVC FREE MED DISP CLR (MED SURG SUPPLIES) ×1 IMPLANT
TAPE TRANSPORE 1IN 12/BX_120/CS 15271 (WOUND CARE SUPPLY) ×1 IMPLANT
TOWEL 24X16IN COTTON BLU DISP SURG STRL LF (DRAPE/PACKS/SHEETS/OR TOWEL) ×1 IMPLANT
TRACKER NAVIGATE FUS  AXIEM ENT INSTR STRL LF  DISP (MED SURG SUPPLIES) ×1 IMPLANT
TRACKER NAVIGATE FUS  AXIEM NINVS STRL LF  DISP (MED SURG SUPPLIES) ×1 IMPLANT
TUBE ENDOS CLN INSTACLEAR STD SET LEN STRL LF  DISP (ENDOSCOPIC SUPPLIES) ×1 IMPLANT
TUBING SUCT CLR 6FT .25IN ARGYLE PVC NCDTV STR MALE FEMALE MLD CONN STRL LF (MED SURG SUPPLIES) ×3 IMPLANT

## 2022-11-08 NOTE — OR Surgeon (Signed)
[facility]  Operative Note     PATIENT NAME:  Mark Patton, Mark Patton  MRN:  [id number]  DOB:  [birth year]    Date of Procedure:  11/08/2022  Preoperative Diagnosis: NASAL SEPTAL DEVIATION; NASAL VALVE COLLAPSE; HYPERTROPHIC NASAL TURBINATES   Postoperative Diagnoses:  NASAL SEPTAL DEVIATION; NASAL VALVE COLLAPSE; HYPERTROPHIC NASAL TURBINATES   Procedure Performed:   Bilateral maxillary antrostomy with tissue removal, bilateral anterior ethmoidectomy, Septoplasty, Excision of bilateral concha bullosa, and Nasal valve repair with alar batten cartilage grafts.   Surgeon: Conchita Paris, DO   Anesthesia: Anesthesiologist: Cam Hai, MD  CRNA: Margaretha Glassing, CRNA; Bobette Mo, CRNA   OR Staff: Circulator: Stephan Minister, RN  Relief Circulator: Alease Medina, RN  Scrub First Assist: Revonda Humphrey, RN   Estimated Blood Loss:  Minimal  Specimens:   ID Type Source Tests Collected by Time Destination   1 : NASAL AND SEPTAL CONTENTS Tissue Sinus SURGICAL PATHOLOGY SPECIMEN Conchita Paris, DO 11/08/2022 1206       Complications: None immediate  Indications For Procedure:  As per the above aforementioned diagnoses    Mark Patton  is a very pleasant  32 y.o. male  presenting  for NASAL SEPTAL DEVIATION; NASAL VALVE COLLAPSE; HYPERTROPHIC NASAL TURBINATES Risks, benefits, indications, and complications of procedure were discussed, and informed signed consent obtained.  Description of Procedure:  After informed consent was reviewed and confirmed in the preoperative holding area the patient was transferred to the operating suite and placed on the table in the supine position. The patient was correctly identified by name as well as wrist band.  Appropriate timeout was taken prior to the start of the procedure.  Next the anesthesiologist successfully induced and intubated the patient.  The table was then rotated 90 away from the anesthesia circuit.  Patient was prepped and draped in  a sterile fashion, including set up/ calibration, reference frame, registration and accuracy verification of the fusion image guidance system.  Review/surgical planning with the patient's pre-loaded CT maxillofacial/skull base was performed including delineation of target sinuses and there relationships to critical structures (skull base, cribriform/lateral lamella (keros classification) and orbit).  This system was available during the entirety of the case and use to confirm target sinuses and trajectory.  Next 4%  cocaine soaked pledget were placed in the nasal cavities bilaterally and allowed to set for adequate decongestion and vasoconstriction.   1% lidocaine with 100,000 epinephrine was injected in the bilateral inferior turbinates as well as bilateral septum.  We first proceeded by performing the bilateral inferior turbinate reduction.  Using a 2 mm Medtronic microdebrider the head of the left inferior turbinate was incised and partial excision of the bony concha/turbinate bone in a submucosal fashion was achieved.  In a similar fashion the right inferior turbinate was also reduced using the 2 mm microdebrider, by incising the right head of the inferior turbinate with the blade portion of the 2 mm microdebrider, partial excision of the bony concha/turbinate bone in a submucosal fashion was achieved. Hemostasis with suction Bovie performed. Next we proceeded with the septoplasty. Using a #15 blade a left hemitransfixion incision was created down to cartilage along the caudal margin of the septum.  A submucoperichondrial plane was created with a cottle elevator. Next, a mucoperichondrial flap was extended past the bony cartilaginous junction and down to the floor exposing the entirety of the structural components of the nasal septum.  Next, using a D knife a cartilaginous incision was created approximately 1.5  cm from the caudal margin of the quadrangular cartilage, care was taken not to incise the  contralateral mucoperichondrial flap elevation of the contralateral mucoperichondrial flap was performed past the bony cartilaginous junction with the aid of a long nasal speculum.  Once the entirety of the structural components of the septum were exposed, harvest of a rectangular piece of the quadrangular cartilage from the inferior posterior aspect was achieved for planned alar batten grafts.  This cartilage was set aside in saline.  Next the remainder deflected portions of the bony septum including a large spur were removed after first disarticulating the perpendicular plate of the ethmoid from the skull base using double-action scissors.  The spur was then mobilized and removed after disarticulating it inferiorly with open Jansen-Middleton forceps.  An adequate supporting L strut was maintained. Next we irrigated within the flap, hemostasis was achieved.  The left hemitransfixion incision was reapproximated with 4-0 plain gut suture in a simple interrupted fashion.  A quilting stitch with a 4-0 plain gut was performed to approximate the bilateral mucoperichondrial flaps.  Next,  a 0 endoscope is a visualization of the right nasal cavity and OMC region.  Injection with 1% lidocaine 100,000 epinephrine was performed in the head and root of the middle turbinate as well as the uncinate and sphenoid region along the ground lamella cocaine soaked pledgets are placed in the middle meatus and allowed to set for adequate vasoconstriction.  Verification of surgical landmarks achieved with image guidance probe.  Next using a ball seeker probe the  uncinate and natural maxillary ostia were identified and verified with the fusion system.  Backbiter forceps were used to remove the inferior portion of the uncinate and widened the antrostomy, multiple obstructing polyps and polypoid tissue from the maxillary antrum were removed and sent for pathologic identification.  The remainder of the uncinate and widening of the  antrostomy was performed with a 4 mm microdebrider.    Next the medial/inferior aspect of the ethmoid bulla was entered using a 4 mm microdebrider and the remainder of the anterior ethmoid air cells were also removed. In a similar fashion the left side was performed, using a 0 endoscope, visualization of the  nasal cavity and OMC region. Blakesley forceps were used to grasp a few of the polyps to send for pathological identification.  Injection with 1% lidocaine 100,000 epinephrine was performed in the head and root of the middle turbinate as well as the uncinate and sphenoid region along the ground lamella cocaine soaked pledgets are placed in the middle meatus and allowed to set for adequate vasoconstriction. Verification of surgical landmarks achieved with image guidance probe.   Next using a ball seeker probe the uncinate and natural maxillary ostia were identified and verified with the fusion system. Backbiter forceps were used to remove the inferior portion of the uncinate and widened the antrostomy, polypoid tissue from the maxillary antrostomy opening was also removed and sent for pathologic identification.  The remainder of the uncinate and widening of the antrostomy was performed with a 4 mm microdebrider.  Next the medial/inferior aspect of the ethmoid bulla was entered using a 4 mm microdebrider and the remainder of the anterior ethmoid air cells were also removed.   Surgi-Flo foam was instilled into surgical site bilaterally as well as MeroGel in the middle meatus bilaterally. Next the pre-harvested cartilage was shaped and sized with a #15 blade for the planned alar batten graft placement.  1% lidocaine with 1/100,000 epinephrine was injected to the  lateral marginal region of the lower lateral cartilage, using a #15 blade a small incision was made with use of Converse scissors to create a precise pocket overlying the lateral crus spanning to the piriform aperture.  The graft was inserted into the  strategically placed pocket.  The incision was closed with 1 simple interrupted 4-0 plain gut.  In a similar fashion the contralateral closed alar batten graft was also placed. Doyle splints were placed in the nasal cavities bilaterally and secured to the caudal septum with a 3-0 silk.  Bactroban ointment was instilled in the nares bilaterally followed by placement of a nasal drip pad.   Patient was then transferred back to the anesthesiologist who successfully awoke and extubated the patient.  The patient was transferred to the postanesthesia care unit in stable condition. All instrument, needle and sponge counts were deemed correct at the end of the case.      Conchita Paris D.O.  ENT / Facial Plastic Surgery  Conchita Paris, DO    This note was partially generated using MModal Fluency Direct system, and there may be some incorrect words, spellings, and punctuation that were not noted in checking the note before saving.

## 2022-11-08 NOTE — Anesthesia Postprocedure Evaluation (Signed)
Anesthesia Post Op Evaluation    Patient: Mark Patton  Procedure(s):  FUNCTIONAL ENDOSCOPIC SINUS SURGERY WITH IMAGE GUIDANCE; SEPTOPLASTY WITH NASAL TURBINATE REDUCTION; NASAL VALVE REPAIR WITH ALAR BATTEN GRAFTS  N/A    Last Vitals:Temperature: 36.3 C (97.4 F) (11/08/22 1232)  Heart Rate: 99 (11/08/22 1232)  BP (Non-Invasive): (!) 141/82 (11/08/22 1232)  Respiratory Rate: 20 (11/08/22 1232)  SpO2: 95 % (11/08/22 1232)    There were no known notable events for this encounter.    Patient is sufficiently recovered from the effects of anesthesia to participate in the evaluation and has returned to their pre-procedure level.  Patient location during evaluation: PACU       Patient participation: complete - patient participated  Level of consciousness: awake and alert and responsive to verbal stimuli    Pain score: 0  Pain management: adequate  Airway patency: patent    Anesthetic complications: no  Cardiovascular status: acceptable  Respiratory status: acceptable  Hydration status: acceptable  Patient post-procedure temperature: Pt Normothermic   PONV Status: Absent

## 2022-11-08 NOTE — Nurses Notes (Signed)
Dose of Lortab 7.5mg  given as ordered. In documentation in the pyxis it was inserted that 2.5mg  was wasted

## 2022-11-08 NOTE — Interval H&P Note (Signed)
[facility]      H&P UPDATE FORM                                                                                  Mark Patton, Mark Patton, 32 y.o. male  Date of Admission:  11/08/2022  Date of Birth:  1991-01-02    11/08/2022    STOP: IF H&P IS GREATER THAN 30 DAYS FROM SURGICAL DAY COMPLETE NEW H&P IS REQUIRED.     H & P updated the day of the procedure.  1.  H&P completed within 30 days of surgical procedure and has been reviewed within 24 hours of admission but prior to surgery or a procedure requiring anesthesia services, the patient has been examined, and no change has occured in the patients condition since the H&P was completed.       Change in medications: No              Comments:     2.  Patient continues to be appropriate candidate for planned surgical procedure. YES    Conchita Paris, DO

## 2022-11-08 NOTE — Anesthesia Transfer of Care (Signed)
ANESTHESIA TRANSFER OF CARE   Mark Patton is a 32 y.o. ,male, Weight: (!) 143 kg (315 lb)   had Procedure(s):  FUNCTIONAL ENDOSCOPIC SINUS SURGERY WITH IMAGE GUIDANCE; SEPTOPLASTY WITH NASAL TURBINATE REDUCTION; NASAL VALVE REPAIR WITH ALAR BATTEN GRAFTS  N/A  performed  11/08/22   Primary Service: Conchita Paris, DO    Past Medical History:   Diagnosis Date   . Anxiety    . Pericarditis in diseases classified elsewhere     COVID   . Wears glasses       Allergy History as of 11/08/22        No Known Allergies                  I completed my transfer of care / handoff to the receiving personnel during which we discussed:  Access, Airway, All key/critical aspects of case discussed, Analgesia, Antibiotics, Expectation of post procedure, Fluids/Product, Gave opportunity for questions and acknowledgement of understanding, Labs and PMHx  Report given to: Margret Chance, RN    Post Location: PACU                                                           Last OR Temp: Temperature: 36.5 C (97.7 F)  ABG:   Airway:* No LDAs found *  Blood pressure 132/78, pulse 100, temperature 36.5 C (97.7 F), resp. rate 16, height 1.93 m (6\' 4" ), weight (!) 143 kg (315 lb), SpO2 100%.

## 2022-11-08 NOTE — Discharge Instructions (Signed)
KEEP YOUR FOLLOW UP APT. WITH YOUR SURGEON    NO BLOWING YOUR NOSE, SNEEZE WITH YOUR MOUTH OPEN. LIGHTLY SNIFF IF NECESSARY    CHANGE YOUR DRIP PAD AS NEEDED    OCEAN SPRAY: 2 SPRAY TO EACH NOSTRIL TWICE A DAY AND AS NEEDED.    APPLY OINTMENT TO NARES TWICE A DAY AND AS NEEDED    NO BENDING, LIFTING, OR STRENUOUS ACTIVITY,     DON'T PUT YOUR FINGERS UP YOUR NOSE.AVOID USING STRAWS    SLEEP WITH YOUR HEAD ELEVATED ABOVE 30 DEGREES, A RECLINER IS GREAT OR MULTIPLE PILLOWS.    CALL THE DOCTOR IF ANY QUESTIONS OR CONCERNS, SUCH AS INCREASED PAIN, FOUL SMELLING DRAINAGE, OR A FEVER OF 100.4 OR GREATER.     FOLLOW ALL INSTRUCTIONS GIVEN BY YOUR SURGEON.

## 2022-11-08 NOTE — Anesthesia Preprocedure Evaluation (Signed)
ANESTHESIA PRE-OP EVALUATION  Planned Procedure: FUNCTIONAL ENDOSCOPIC SINUS SURGERY WITH IMAGE GUIDANCE; SEPTOPLASTY WITH NASAL TURBINATE REDUCTION; NASAL VALVE REPAIR WITH ALAR BATTEN GRAFTS  N/A (Nose)  Review of Systems     anesthesia history negative     patient summary reviewed  nursing notes reviewed        Pulmonary  negative pulmonary ROS,    Cardiovascular  negative cardio ROS,   Hyperlipidemia ,No peripheral edema,  Exercise Tolerance: > or = 4 METS        GI/Hepatic/Renal   negative GI/hepatic/renal ROS,         Endo/Other    obesity,      Neuro/Psych/MS    anxiety     Cancer    negative hematology/oncology ROS,                     Physical Assessment      Airway       Mallampati: II    TM distance: >3 FB    Neck ROM: full  Mouth Opening: good.  Facial hair  Beard        Dental           (+) missing           Pulmonary    Breath sounds clear to auscultation  (-) no rhonchi, no decreased breath sounds, no wheezes, no rales and no stridor     Cardiovascular    Rhythm: regular  Rate: Normal  (-) no friction rub, carotid bruit is not present, no peripheral edema and no murmur     Other findings              Plan  ASA 2     Planned anesthesia type: general     general anesthesia with endotracheal tube intubation      PONV Plan:  I plan to administer pharmcologic prophalaxis antiemetics  POV PLAN:   plan for postoperative opioid use            Intravenous induction     Anesthesia issues/risks discussed are: Dental Injuries, Eye /Visual Loss, PONV, Nerve Injuries, Stroke, Aspiration, Difficult Airway, Cardiac Events/MI, Intraoperative Awareness/ Recall, Blood Loss and Sore Throat.  Anesthetic plan and risks discussed with patient  signed consent obtained          Patient's NPO status is appropriate for Anesthesia.           Plan discussed with CRNA.    (Medical clearance by Saralyn Pilar FNP. Have glidescope available)

## 2022-11-08 NOTE — Anesthesia Transfer of Care (Signed)
ANESTHESIA TRANSFER OF CARE   Mark Patton is a 32 y.o. ,male, Weight: (!) 143 kg (315 lb)   had Procedure(s):  FUNCTIONAL ENDOSCOPIC SINUS SURGERY WITH IMAGE GUIDANCE; SEPTOPLASTY WITH NASAL TURBINATE REDUCTION; NASAL VALVE REPAIR WITH ALAR BATTEN GRAFTS  N/A  performed  11/08/22   Primary Service: Conchita Paris, DO    Past Medical History:   Diagnosis Date   . Anxiety    . Pericarditis in diseases classified elsewhere     COVID   . Wears glasses       Allergy History as of 11/08/22        No Known Allergies                  I completed my transfer of care / handoff to the receiving personnel during which we discussed:  Access, All key/critical aspects of case discussed, Airway, Analgesia, Antibiotics, Expectation of post procedure, Fluids/Product, Gave opportunity for questions and acknowledgement of understanding, Labs and PMHx  Report given to: CoxLelon Mast, RN    Post Location: PACU                                                           Last OR Temp: Temperature: 36.3 C (97.4 F)  ABG:   Airway:* No LDAs found *  Blood pressure (!) 141/82, pulse 99, temperature 36.3 C (97.4 F), resp. rate 20, height 1.93 m (6\' 4" ), weight (!) 143 kg (315 lb), SpO2 95%.

## 2022-11-10 DIAGNOSIS — J342 Deviated nasal septum: Secondary | ICD-10-CM

## 2022-11-10 DIAGNOSIS — J343 Hypertrophy of nasal turbinates: Secondary | ICD-10-CM

## 2022-11-10 DIAGNOSIS — J3489 Other specified disorders of nose and nasal sinuses: Secondary | ICD-10-CM

## 2022-11-10 LAB — SURGICAL PATHOLOGY SPECIMEN

## 2022-11-16 ENCOUNTER — Other Ambulatory Visit: Payer: Self-pay

## 2022-11-16 ENCOUNTER — Encounter (INDEPENDENT_AMBULATORY_CARE_PROVIDER_SITE_OTHER): Payer: Self-pay | Admitting: OTOLARYNGOLOGY

## 2022-11-16 ENCOUNTER — Ambulatory Visit (INDEPENDENT_AMBULATORY_CARE_PROVIDER_SITE_OTHER): Admitting: OTOLARYNGOLOGY

## 2022-11-16 VITALS — Ht 76.0 in | Wt 315.0 lb

## 2022-11-16 DIAGNOSIS — Z9889 Other specified postprocedural states: Secondary | ICD-10-CM

## 2022-11-16 NOTE — Procedures (Signed)
[facility], [facility]  [address]  [place] [address]    Procedure Note    Name: Mark Patton MRN:  [id number]   Date: 11/16/2022 DOB:  1990/07/09 (31 y.o.)         31237 - NASAL/SINUS ENDOSCOPY, SURGICAL; W/ BIOPSY, POLYPECTOMY OR DEBRIDEMENT (AMB ONLY)    Performed by: Conchita Paris, DO  Authorized by: Conchita Paris, DO    Time Out:     Immediately before the procedure, a time out was called:  Yes    Patient verified:  Yes    Procedure Verified:  Yes    Site Verified:  Yes  Documentation:      Endoscopically debrided using forceps and suctions.   Crusting removed from [facility] bilaterally and no mucpus was seen.  Patient tolerated procedure well.       Conchita Paris, DO

## 2022-11-16 NOTE — H&P (Signed)
[facility]  [address]  [place] [address]    Progress Note    Name: Mark Patton MRN:  [id number]   Date: 11/16/2022 DOB:  1991/04/19 (31 y.o.)              Follow Up      Subjective:   Chief Complaint:   Post Op (Po rc after FESS done on 11/08/22)       History of Present Illness:  Mark Patton is a 32 y.o. old male who presents to the clinic for follow-up. Patient states bleeding and pain are controlled.    Review of Systems     Physical Exam:     Vitals:    11/16/22 0913   Weight: (!) 143 kg (315 lb)   Height: 1.93 m (6\' 4" )   BMI: 38.42      ENT Physical Exam   Doyles in place.    Assessment and Plan:       ICD-10-CM    1. S/P FESS (functional endoscopic sinus surgery)  Z56.387 56433 - NASAL/SINUS ENDOSCOPY, SURGICAL; W/ BIOPSY, POLYPECTOMY OR DEBRIDEMENT (AMB ONLY)        Orders Placed This Encounter    29518 - NASAL/SINUS ENDOSCOPY, SURGICAL; W/ BIOPSY, POLYPECTOMY OR DEBRIDEMENT (AMB ONLY)     Endoscopically debrided  Doyles removed  Start saline irrigations  Continue bactroban.     Follow up:  Return in about 1 week (around 11/23/2022).    Conchita Paris, DO

## 2022-11-27 ENCOUNTER — Other Ambulatory Visit: Payer: Self-pay

## 2022-11-27 ENCOUNTER — Ambulatory Visit (INDEPENDENT_AMBULATORY_CARE_PROVIDER_SITE_OTHER): Admitting: OTOLARYNGOLOGY

## 2022-11-27 ENCOUNTER — Encounter (INDEPENDENT_AMBULATORY_CARE_PROVIDER_SITE_OTHER): Payer: Self-pay | Admitting: OTOLARYNGOLOGY

## 2022-11-27 VITALS — Ht 76.0 in | Wt 315.0 lb

## 2022-11-27 DIAGNOSIS — Z9889 Other specified postprocedural states: Secondary | ICD-10-CM

## 2022-11-27 NOTE — Procedures (Signed)
[facility]  [address]  [place] [address]    Procedure Note    Name: JAVARIO FOYT MRN:  [id number]   Date: 11/27/2022 DOB:  09-06-1990 (31 y.o.)         31237 - NASAL/SINUS ENDOSCOPY, SURGICAL; W/ BIOPSY, POLYPECTOMY OR DEBRIDEMENT (AMB ONLY)    Performed by: Conchita Paris, DO  Authorized by: Conchita Paris, DO    Time Out:     Immediately before the procedure, a time out was called:  Yes    Patient verified:  Yes    Procedure Verified:  Yes    Site Verified:  Yes  Documentation:      Endoscopically debrided using forceps and suctions.   Crusting removed from [facility] bilaterally and no mucpus was seen.  Patient tolerated procedure well.       Conchita Paris, DO

## 2022-11-27 NOTE — H&P (Signed)
[facility]  [address]  [place] [address]    Progress Note    Name: AMARII NINI MRN:  [id number]   Date: 11/27/2022 DOB:  June 02, 1990 (31 y.o.)              Follow Up      Subjective:   Chief Complaint:   Sinus Problem (1 week rc on sinuses. FESS done 11/08/22)       History of Present Illness:  ZAYVION KOLTUN is a 32 y.o. old male who presents to the clinic for follow-up. States having some congestion. Using saline irrigations    Review of Systems     Physical Exam:     Vitals:    11/27/22 1127   Weight: (!) 143 kg (315 lb)   Height: 1.93 m (6\' 4" )   BMI: 38.42      ENT Physical Exam   Septum midline  Assessment and Plan:       ICD-10-CM    1. S/P FESS (functional endoscopic sinus surgery)  Y78.295 62130 - NASAL/SINUS ENDOSCOPY, SURGICAL; W/ BIOPSY, POLYPECTOMY OR DEBRIDEMENT (AMB ONLY)        Orders Placed This Encounter    86578 - NASAL/SINUS ENDOSCOPY, SURGICAL; W/ BIOPSY, POLYPECTOMY OR DEBRIDEMENT (AMB ONLY)     Endoscopically debrided  Continue nasal saline irrigations  Continue bactroban.     Follow up:  Return in about 2 weeks (around 12/11/2022).    Conchita Paris, DO

## 2022-12-11 ENCOUNTER — Ambulatory Visit (INDEPENDENT_AMBULATORY_CARE_PROVIDER_SITE_OTHER): Admitting: OTOLARYNGOLOGY

## 2022-12-11 ENCOUNTER — Other Ambulatory Visit: Payer: Self-pay

## 2022-12-11 ENCOUNTER — Encounter (INDEPENDENT_AMBULATORY_CARE_PROVIDER_SITE_OTHER): Payer: Self-pay | Admitting: OTOLARYNGOLOGY

## 2022-12-11 VITALS — Ht 76.0 in | Wt 315.0 lb

## 2022-12-11 DIAGNOSIS — J329 Chronic sinusitis, unspecified: Secondary | ICD-10-CM

## 2022-12-11 DIAGNOSIS — J309 Allergic rhinitis, unspecified: Secondary | ICD-10-CM

## 2022-12-11 DIAGNOSIS — Z9889 Other specified postprocedural states: Secondary | ICD-10-CM

## 2022-12-11 NOTE — H&P (Signed)
[facility]  [address]  [place] [address]    Return Patient Visit    Name: [name] MRN:  [id number]   Date: 12/11/2022 DOB: 01/06/1991 (32 y.o.)       Referring Provider:  No ref. provider found    Reason for Visit:   Chief Complaint   Patient presents with    Post Op     PO FESS 6/19        History of Present Illness:  Mark Patton is a 32 y.o. male who is FU on FESS. He c/o L nasal congestion which is improving on its own. Using saline rinses.       Patient History:  Patient Active Problem List   Diagnosis    Chronic sinusitis     Current Outpatient Medications   Medication Sig    busPIRone (BUSPAR) 5 mg Oral Tablet Take 1 Tablet (5 mg total) by mouth Once a day    DULoxetine (CYMBALTA DR) 60 mg Oral Capsule, Delayed Release(E.C.) Take 1 Capsule (60 mg total) by mouth Once a day      No Known Allergies  Past Medical History:   Diagnosis Date    Anxiety     Pericarditis in diseases classified elsewhere     COVID    Wears glasses      Past Surgical History:   Procedure Laterality Date    CARDIAC CATHETERIZATION      HX HEART CATHETERIZATION      MOUTH SURGERY      WISDOM TEETH     Family Medical History:    None         Social History     Tobacco Use    Smoking status: Never    Smokeless tobacco: Former   Haematologist status: Every Day   Substance Use Topics    Alcohol use: Not Currently    Drug use: Never       Review of Systems:  Review of Systems    Physical Exam:  Ht 1.93 m (6\' 4" )   Wt (!) 143 kg (315 lb)   BMI 38.34 kg/m       ENT Physical Exam  Constitutional  Appearance: patient appears well-developed, well-nourished and well-groomed,  Communication/Voice: communication appropriate for developmental age; vocal quality normal;  Head and Face  Appearance: head appears normal, face appears normal and face appears atraumatic;  Palpation: facial palpation normal;  Salivary: glands normal;  Ear  Hearing: intact;  Auricles: right auricle normal; left auricle  normal;  External Mastoids: right external mastoid normal; left external mastoid normal;  Ear Canals: right ear canal normal; left ear canal normal;  Tympanic Membranes: right tympanic membrane normal; left tympanic membrane normal;  Nose  External Nose: nares patent bilaterally; external nose normal;  Internal Nose: septum normal; bilateral inferior turbinates erythematous; with hypertrophy;  Nose comments: Patent antrostomies  Oral Cavity/Oropharynx  Lips: normal;  Teeth: normal;  Gums: gingiva normal;  Tongue: normal;  Oral mucosa: normal;  Hard palate: normal;  Soft palate: normal;  Tonsils: normal;  Base of Tongue: normal;  Posterior pharyngeal wall: normal;  Neck  Neck: neck normal; neck palpation normal;  Thyroid: thyroid normal;  Respiratory  Inspection: breathing unlabored; normal breathing rate;  Lymphatic  Palpation: lymph nodes normal;  Neurovestibular  Mental Status: alert and oriented;  Psychiatric: mood normal; affect is appropriate;  Cranial Nerves: cranial nerves intact;       Assessment:  ENCOUNTER DIAGNOSES     ICD-10-CM   1. S/P FESS (functional endoscopic sinus surgery)  Z98.890   2. Chronic allergic rhinitis  J30.9   3. Chronic sinusitis, unspecified location  J32.9       Plan:  Medical records reviewed on 12/11/2022.  Healing well. Cont saline.   Orders Placed This Encounter    CANCELED: 97353 - NASAL ENDOSCOPY DIAGNOSTIC UNILATERAL OR BILATERAL (AMB ONLY)    29924 - NASAL/SINUS ENDOSCOPY, SURGICAL; W/ BIOPSY, POLYPECTOMY OR DEBRIDEMENT (AMB ONLY)     Return in about 4 weeks (around 01/08/2023).    Marcelline Deist, PA-C  The advanced practice clinician's documentation was reviewed/amended in its entirety with the assessment and plan portion completely performed independently by me during this separate encounter.

## 2022-12-11 NOTE — Procedures (Signed)
[facility]  [address]  [place] [address]    Procedure Note    Name: Mark Patton MRN:  [id number]   Date: 12/11/2022 DOB:  05-Nov-1990 (31 y.o.)         31237 - NASAL/SINUS ENDOSCOPY, SURGICAL; W/ BIOPSY, POLYPECTOMY OR DEBRIDEMENT (AMB ONLY)    Performed by: Conchita Paris, DO  Authorized by: Conchita Paris, DO    Time Out:     Immediately before the procedure, a time out was called:  Yes    Patient verified:  Yes    Procedure Verified:  Yes    Site Verified:  Yes  Documentation:      Endoscopically debrided using forceps and suctions.   Crusting removed from [facility] bilaterally and no mucpus was seen.  Patient tolerated procedure well.       Conchita Paris, DO

## 2023-01-11 ENCOUNTER — Encounter (INDEPENDENT_AMBULATORY_CARE_PROVIDER_SITE_OTHER): Payer: Self-pay | Admitting: OTOLARYNGOLOGY
